# Patient Record
Sex: Female | Born: 1955 | Marital: Single | Smoking: Light tobacco smoker
Health system: Southern US, Community
[De-identification: ages and names within clinical notes are randomized; demographics above are authoritative.]

## PROBLEM LIST (undated history)

## (undated) DIAGNOSIS — E119 Type 2 diabetes mellitus without complications: Secondary | ICD-10-CM

## (undated) DIAGNOSIS — J45909 Unspecified asthma, uncomplicated: Secondary | ICD-10-CM

## (undated) HISTORY — PX: CHOLECYSTECTOMY: SHX55

---

## 2004-04-07 ENCOUNTER — Ambulatory Visit: Payer: Self-pay | Admitting: Family Medicine

## 2006-06-13 ENCOUNTER — Ambulatory Visit: Payer: Self-pay | Admitting: Nurse Practitioner

## 2007-06-22 ENCOUNTER — Ambulatory Visit: Payer: Self-pay | Admitting: *Deleted

## 2007-10-04 ENCOUNTER — Ambulatory Visit: Payer: Self-pay | Admitting: Gastroenterology

## 2007-10-19 ENCOUNTER — Ambulatory Visit: Payer: Self-pay | Admitting: Gastroenterology

## 2007-11-17 ENCOUNTER — Other Ambulatory Visit: Payer: Self-pay

## 2007-11-17 ENCOUNTER — Ambulatory Visit: Payer: Self-pay | Admitting: Surgery

## 2007-11-28 ENCOUNTER — Ambulatory Visit: Payer: Self-pay | Admitting: Surgery

## 2008-01-23 ENCOUNTER — Ambulatory Visit: Payer: Self-pay | Admitting: Family Medicine

## 2012-08-08 ENCOUNTER — Other Ambulatory Visit (HOSPITAL_COMMUNITY)
Admission: RE | Admit: 2012-08-08 | Discharge: 2012-08-08 | Disposition: A | Discharge status: Home / Self Care | Payer: BC Managed Care – PPO | Source: Ambulatory Visit | Attending: Family Medicine | Admitting: Family Medicine

## 2012-08-08 DIAGNOSIS — Z01419 Encounter for gynecological examination (general) (routine) without abnormal findings: Secondary | ICD-10-CM | POA: Insufficient documentation

## 2013-09-25 ENCOUNTER — Ambulatory Visit: Payer: Self-pay | Admitting: Neurosurgery

## 2016-02-20 ENCOUNTER — Other Ambulatory Visit: Payer: Self-pay | Admitting: Family Medicine

## 2016-02-20 ENCOUNTER — Ambulatory Visit
Admission: RE | Admit: 2016-02-20 | Discharge: 2016-02-20 | Disposition: A | Discharge status: Home / Self Care | Payer: BLUE CROSS/BLUE SHIELD | Source: Ambulatory Visit | Attending: Family Medicine | Admitting: Family Medicine

## 2016-02-20 DIAGNOSIS — M79671 Pain in right foot: Secondary | ICD-10-CM

## 2016-04-28 DIAGNOSIS — M2550 Pain in unspecified joint: Secondary | ICD-10-CM | POA: Diagnosis not present

## 2016-04-28 DIAGNOSIS — M62830 Muscle spasm of back: Secondary | ICD-10-CM | POA: Diagnosis not present

## 2016-04-28 DIAGNOSIS — G4700 Insomnia, unspecified: Secondary | ICD-10-CM | POA: Diagnosis not present

## 2016-04-28 DIAGNOSIS — Z23 Encounter for immunization: Secondary | ICD-10-CM | POA: Diagnosis not present

## 2016-04-28 DIAGNOSIS — E1140 Type 2 diabetes mellitus with diabetic neuropathy, unspecified: Secondary | ICD-10-CM | POA: Diagnosis not present

## 2016-06-16 DIAGNOSIS — M47812 Spondylosis without myelopathy or radiculopathy, cervical region: Secondary | ICD-10-CM | POA: Diagnosis not present

## 2016-06-16 DIAGNOSIS — M542 Cervicalgia: Secondary | ICD-10-CM | POA: Diagnosis not present

## 2016-06-16 DIAGNOSIS — Z79891 Long term (current) use of opiate analgesic: Secondary | ICD-10-CM | POA: Diagnosis not present

## 2016-06-16 DIAGNOSIS — M5030 Other cervical disc degeneration, unspecified cervical region: Secondary | ICD-10-CM | POA: Diagnosis not present

## 2016-06-16 DIAGNOSIS — Z79899 Other long term (current) drug therapy: Secondary | ICD-10-CM | POA: Diagnosis not present

## 2016-06-16 DIAGNOSIS — G894 Chronic pain syndrome: Secondary | ICD-10-CM | POA: Diagnosis not present

## 2016-06-21 DIAGNOSIS — M79609 Pain in unspecified limb: Secondary | ICD-10-CM | POA: Diagnosis not present

## 2016-06-21 DIAGNOSIS — M5030 Other cervical disc degeneration, unspecified cervical region: Secondary | ICD-10-CM | POA: Diagnosis not present

## 2016-06-21 DIAGNOSIS — M47812 Spondylosis without myelopathy or radiculopathy, cervical region: Secondary | ICD-10-CM | POA: Diagnosis not present

## 2016-06-21 DIAGNOSIS — M542 Cervicalgia: Secondary | ICD-10-CM | POA: Diagnosis not present

## 2016-07-06 DIAGNOSIS — G5603 Carpal tunnel syndrome, bilateral upper limbs: Secondary | ICD-10-CM | POA: Diagnosis not present

## 2016-07-06 DIAGNOSIS — M5412 Radiculopathy, cervical region: Secondary | ICD-10-CM | POA: Diagnosis not present

## 2016-07-12 DIAGNOSIS — Z79899 Other long term (current) drug therapy: Secondary | ICD-10-CM | POA: Diagnosis not present

## 2016-07-12 DIAGNOSIS — Z79891 Long term (current) use of opiate analgesic: Secondary | ICD-10-CM | POA: Diagnosis not present

## 2016-07-12 DIAGNOSIS — M542 Cervicalgia: Secondary | ICD-10-CM | POA: Diagnosis not present

## 2016-07-12 DIAGNOSIS — G894 Chronic pain syndrome: Secondary | ICD-10-CM | POA: Diagnosis not present

## 2016-08-02 DIAGNOSIS — M47812 Spondylosis without myelopathy or radiculopathy, cervical region: Secondary | ICD-10-CM | POA: Diagnosis not present

## 2016-08-02 DIAGNOSIS — M5030 Other cervical disc degeneration, unspecified cervical region: Secondary | ICD-10-CM | POA: Diagnosis not present

## 2016-08-02 DIAGNOSIS — M542 Cervicalgia: Secondary | ICD-10-CM | POA: Diagnosis not present

## 2016-08-02 DIAGNOSIS — G894 Chronic pain syndrome: Secondary | ICD-10-CM | POA: Diagnosis not present

## 2016-09-24 ENCOUNTER — Emergency Department
Admission: EM | Admit: 2016-09-24 | Discharge: 2016-09-24 | Disposition: A | Discharge status: Home / Self Care | Payer: Worker's Compensation | Attending: Emergency Medicine | Admitting: Emergency Medicine

## 2016-09-24 ENCOUNTER — Encounter: Payer: Self-pay | Admitting: Emergency Medicine

## 2016-09-24 ENCOUNTER — Emergency Department: Payer: Worker's Compensation

## 2016-09-24 DIAGNOSIS — Z23 Encounter for immunization: Secondary | ICD-10-CM | POA: Insufficient documentation

## 2016-09-24 DIAGNOSIS — Y999 Unspecified external cause status: Secondary | ICD-10-CM | POA: Insufficient documentation

## 2016-09-24 DIAGNOSIS — M25579 Pain in unspecified ankle and joints of unspecified foot: Secondary | ICD-10-CM | POA: Diagnosis not present

## 2016-09-24 DIAGNOSIS — T148XXA Other injury of unspecified body region, initial encounter: Secondary | ICD-10-CM | POA: Diagnosis not present

## 2016-09-24 DIAGNOSIS — J45909 Unspecified asthma, uncomplicated: Secondary | ICD-10-CM | POA: Insufficient documentation

## 2016-09-24 DIAGNOSIS — S93402A Sprain of unspecified ligament of left ankle, initial encounter: Secondary | ICD-10-CM | POA: Insufficient documentation

## 2016-09-24 DIAGNOSIS — S80211A Abrasion, right knee, initial encounter: Secondary | ICD-10-CM | POA: Diagnosis not present

## 2016-09-24 DIAGNOSIS — Y939 Activity, unspecified: Secondary | ICD-10-CM | POA: Insufficient documentation

## 2016-09-24 DIAGNOSIS — E119 Type 2 diabetes mellitus without complications: Secondary | ICD-10-CM | POA: Insufficient documentation

## 2016-09-24 DIAGNOSIS — S80212A Abrasion, left knee, initial encounter: Secondary | ICD-10-CM | POA: Insufficient documentation

## 2016-09-24 DIAGNOSIS — F17200 Nicotine dependence, unspecified, uncomplicated: Secondary | ICD-10-CM | POA: Insufficient documentation

## 2016-09-24 DIAGNOSIS — Y929 Unspecified place or not applicable: Secondary | ICD-10-CM | POA: Insufficient documentation

## 2016-09-24 DIAGNOSIS — T07XXXA Unspecified multiple injuries, initial encounter: Secondary | ICD-10-CM

## 2016-09-24 DIAGNOSIS — S99912A Unspecified injury of left ankle, initial encounter: Secondary | ICD-10-CM | POA: Diagnosis not present

## 2016-09-24 DIAGNOSIS — M25572 Pain in left ankle and joints of left foot: Secondary | ICD-10-CM | POA: Diagnosis not present

## 2016-09-24 DIAGNOSIS — W010XXA Fall on same level from slipping, tripping and stumbling without subsequent striking against object, initial encounter: Secondary | ICD-10-CM | POA: Insufficient documentation

## 2016-09-24 HISTORY — DX: Type 2 diabetes mellitus without complications: E11.9

## 2016-09-24 HISTORY — DX: Unspecified asthma, uncomplicated: J45.909

## 2016-09-24 MED ORDER — IBUPROFEN 600 MG PO TABS
600.0000 mg | ORAL_TABLET | Freq: Once | ORAL | Status: AC
  Administered 2016-09-24: 600 mg via ORAL
  Filled 2016-09-24: qty 1

## 2016-09-24 MED ORDER — TETANUS-DIPHTH-ACELL PERTUSSIS 5-2.5-18.5 LF-MCG/0.5 IM SUSP
0.5000 mL | Freq: Once | INTRAMUSCULAR | Status: AC
  Administered 2016-09-24: 0.5 mL via INTRAMUSCULAR
  Filled 2016-09-24: qty 0.5

## 2016-09-24 MED ORDER — MORPHINE SULFATE (PF) 4 MG/ML IV SOLN
4.0000 mg | Freq: Once | INTRAVENOUS | Status: AC
Start: 2016-09-24 — End: 2016-09-24
  Administered 2016-09-24: 4 mg via INTRAVENOUS
  Filled 2016-09-24: qty 1

## 2016-09-24 MED ORDER — ONDANSETRON HCL 4 MG/2ML IJ SOLN
4.0000 mg | Freq: Once | INTRAMUSCULAR | Status: AC
  Administered 2016-09-24: 4 mg via INTRAVENOUS
  Filled 2016-09-24: qty 2

## 2016-09-24 NOTE — ED Triage Notes (Signed)
Pt arrived to ED by EMS from work after falling injuring her left ankle and left knee. EMS gave 15 mcg of Fentanyl in route.

## 2016-09-24 NOTE — ED Notes (Signed)
Report from kim, rn.  

## 2016-09-24 NOTE — ED Notes (Signed)
E-signature pad not working in room at this time. Pt verbally consented to understanding of DC instructions.

## 2016-09-24 NOTE — ED Provider Notes (Signed)
[facility] Emergency Department Provider Note ____________________________________________   I have reviewed the triage vital signs and the triage nursing note.  HISTORY  Chief Complaint Fall   Historian Patient  HPI Colleen Andrade is a 61 y.o. female presenting for left ankle pain and swelling after tripping and falling. She landed onto her knees and has an abrasion to her left knee, and mild discomfort to her hands, but severe pain and swelling to her left ankle.  No head injury or neck pain, chest or abdominal pain. No hip pain.    Past Medical History:  Diagnosis Date  . Asthma   . Diabetes mellitus without complication (HCC)     There are no active problems to display for this patient.   Past Surgical History:  Procedure Laterality Date  . CHOLECYSTECTOMY      Prior to Admission medications   Not on File    No Known Allergies  History reviewed. No pertinent family history.  Social History Social History  Substance Use Topics  . Smoking status: Light Tobacco Smoker  . Smokeless tobacco: Never Used  . Alcohol use No    Review of Systems  Constitutional: Negative for fever. Eyes: Negative for visual changes. ENT: Negative for sore throat. Cardiovascular: Negative for chest pain. Respiratory: Negative for shortness of breath. Gastrointestinal: Negative for abdominal pain, vomiting and diarrhea. Genitourinary: Negative for dysuria. Musculoskeletal: Negative for back pain.  The ankle pain as per history of present illness. Skin: Negative for rash. Neurological: Negative for headache. 10 point Review of Systems otherwise negative ____________________________________________   PHYSICAL EXAM:  VITAL SIGNS: ED Triage Vitals  Enc Vitals Group     BP 09/24/16 1723 (!) 153/78     Pulse Rate 09/24/16 1721 69     Resp 09/24/16 1721 18     Temp 09/24/16 1721 97.9 F (36.6 C)     Temp Source 09/24/16 1721 Oral     SpO2  09/24/16 1718 94 %     Weight 09/24/16 1722 240 lb (108.9 kg)     Height 09/24/16 1722 5\' 6"  (1.676 m)     Head Circumference --      Peak Flow --      Pain Score 09/24/16 1722 7     Pain Loc --      Pain Edu? --      Excl. in GC? --      Constitutional: Alert and oriented. Well appearing and in no distress. HEENT   Head: Normocephalic and atraumatic.      Eyes: Conjunctivae are normal. PERRL. Normal extraocular movements.      Ears:         Nose: No congestion/rhinnorhea.   Mouth/Throat: Mucous membranes are moist.   Neck: No stridor. Cardiovascular/Chest: Normal rate, regular rhythm.  No murmurs, rubs, or gallops. Respiratory: Normal respiratory effort without tachypnea nor retractions. Breath sounds are clear and equal bilaterally. No wheezes/rales/rhonchi. Gastrointestinal: Soft. No distention, no guarding, no rebound. Nontender.    Genitourinary/rectal:Deferred Musculoskeletal: Pelvis stable. Left ankle moderately swollen especially on the lateral aspect. Neurovascularly intact left lower extremity. Other four extremities without traumatic findings other than mild abrasion to right and left knees. Neurologic:  Normal speech and language. No gross or focal neurologic deficits are appreciated. Skin:  Skin is warm, dry and intact. No rash noted. Psychiatric: Mood and affect are normal. Speech and behavior are normal. Patient exhibits appropriate insight and judgment.   ____________________________________________  LABS (pertinent positives/negatives)  Labs Reviewed -  No data to display  ____________________________________________    EKG I, Governor Rooks, MD, the attending physician have personally viewed and interpreted all ECGs.  None ____________________________________________  RADIOLOGY All Xrays were viewed by me. Imaging interpreted by Radiologist.  Left ankle x-ray completed:  FINDINGS: No fracture or malalignment. Minimal spurring medially.  Small plantar calcaneal spur  IMPRESSION: 1. No acute osseous abnormality 2. Small plantar calcaneal spur __________________________________________  PROCEDURES  Procedure(s) performed: Ace wrap placed by tech or nurse  Critical Care performed: None  ____________________________________________   ED COURSE / ASSESSMENT AND PLAN  Pertinent labs & imaging results that were available during my care of the patient were reviewed by me and considered in my medical decision making (see chart for details).   Her main complaint is left ankle pain whether swelling. X-ray shows no fracture. She'll be treated clinically as sprain. She was given an Ace wrap here. We discussed anti-inflammatory ibuprofen as well as ice and elevation. I recommended she follow up with her primary care doctor.  She has a couple of abrasions to her knees. Last tetanus shot about 7 years ago, given tdap here.   CONSULTATIONS:   None   Patient / Family / Caregiver informed of clinical course, medical decision-making process, and agree with plan.   I discussed return precautions, follow-up instructions, and discharge instructions with patient and/or family.   ___________________________________________   FINAL CLINICAL IMPRESSION(S) / ED DIAGNOSES   Final diagnoses:  Sprain of left ankle, unspecified ligament, initial encounter  Abrasion, multiple sites              Note: This dictation was prepared with Dragon dictation. Any transcriptional errors that result from this process are unintentional    Governor Rooks, MD 09/24/16 [phone]

## 2016-09-24 NOTE — Discharge Instructions (Signed)
You were evaluated after ankle injury, and no fracture was found.  You were placed in an ace wrap due to suspected sprain.   You may ice pack the area for 15 minutes avery few hours for the next 24 hours.  You may take over the counter ibuprofen 600mg  every 8 hours as needed for 4-5 days.  Return to the emergency department immediately for any new or worsening pain, numbness, tingling, or any new concerns such as neck pain, chest pain, trouble breathing.  Follow-up with your primary care doctor for reevaluation in about one week. You may bear weight as tolerated.

## 2016-10-08 ENCOUNTER — Ambulatory Visit (INDEPENDENT_AMBULATORY_CARE_PROVIDER_SITE_OTHER): Payer: Worker's Compensation

## 2016-10-08 ENCOUNTER — Ambulatory Visit (INDEPENDENT_AMBULATORY_CARE_PROVIDER_SITE_OTHER): Payer: Worker's Compensation | Admitting: Family Medicine

## 2016-10-08 VITALS — BP 138/82 | HR 64 | Temp 98.2°F | Resp 18 | Ht 66.0 in | Wt 238.2 lb

## 2016-10-08 DIAGNOSIS — S93492A Sprain of other ligament of left ankle, initial encounter: Secondary | ICD-10-CM

## 2016-10-08 DIAGNOSIS — M79672 Pain in left foot: Secondary | ICD-10-CM

## 2016-10-08 DIAGNOSIS — S93602A Unspecified sprain of left foot, initial encounter: Secondary | ICD-10-CM | POA: Diagnosis not present

## 2016-10-08 MED ORDER — MELOXICAM 15 MG PO TABS: 15.0000 mg | ORAL_TABLET | Freq: Every day | ORAL | 0 refills | Status: DC

## 2016-10-08 NOTE — Patient Instructions (Addendum)
Do not use any other otc pain medication other than tylenol/acetaminophen - so no aleve, ibuprofen, motrin, advil, etc.   How to Use a Stirrup Ankle Brace A stirrup ankle brace is a device that is used to provide stability to an injured ankle so it can heal. It may be put on after an ankle injury, such as an ankle strain or sprain. It may also be put on after a cast around the ankle has been removed. A stirrup ankle brace can be worn comfortably inside most athletic shoes. An ankle brace is made of stiff material that fits under the arch of the foot and goes up the sides of the leg. The liner of the brace may be made of a soft gel-like material, or it may be inflatable. The liner lets the brace fit the ankle well without causing pressure on the ankle bones. How do I put on a stirrup ankle brace?  Place the bottom of your foot on the bottom of the brace (stirrup).  Pull the brace up against the sides of your ankle.  To keep the brace in place, fasten the straps on the brace or use an elastic wrap.  Adjust the straps or elastic wrap so that your ankle is snugly supported. The brace should not be loose or too tight. What if I feel pain or discomfort? If the brace feels too tight, you should be able to easily adjust it to make it more comfortable. Let your health care provider know if you cannot adjust your brace in a way that is comfortable for you. Do not wear the brace if it causes pain or swelling. Follow these instructions at home:  Wear this brace as told by your health care provider.  Remove the brace when you shower or bathe.  Loosen the brace if your toes tingle, become numb, or turn cold and blue.  Try not to move your ankle too much, but wiggle your toes from time to time. This helps to prevent swelling.  Keep your brace clean and dry. Contact a health care provider if:  You have increased bruising, swelling, or pain.  Your brace causes pain or swelling.  You cannot adjust  your brace to make it comfortable. Get help right away if:  Your toes become blue or cold, and this does not get better when you loosen the brace. This information is not intended to replace advice given to you by your health care provider. Make sure you discuss any questions you have with your health care provider. Document Released: 04/21/2004 Document Revised: 11/27/2015 Document Reviewed: 01/21/2015 Elsevier Interactive Patient Education  2017 [employer] Foot Sprain A foot sprain is an injury to one of the strong bands of tissue (ligaments) that connect and support the many bones in your feet. The ligament can be stretched too much or it can tear. A tear can be either partial or complete. The severity of the sprain depends on how much of the ligament was damaged or torn. What are the causes? A foot sprain is usually caused by suddenly twisting or pivoting your foot. What increases the risk? This injury is more likely to occur in people who:  Play a sport, such as basketball or football.  Exercise or play a sport without warming up.  Start a new workout or sport.  Suddenly increase how long or hard they exercise or play a sport. What are the signs or symptoms? Symptoms of this condition start soon after an injury and include:  Pain, especially in the arch of the foot.  Bruising.  Swelling.  Inability to walk or use the foot to support body weight. How is this diagnosed? This condition is diagnosed with a medical history and physical exam. You may also have imaging tests, such as:  X-rays to make sure there are no broken bones (fractures).  MRI to see if the ligament has torn. How is this treated? Treatment varies depending on the severity of your sprain. Mild sprains can be treated with rest, ice, compression, and elevation (RICE). If your ligament is overstretched or partially torn, treatment usually involves keeping your foot in a fixed position (immobilization) for a  period of time. To help you do this, your health care provider will apply a bandage, splint, or walking boot to keep your foot from moving until it heals. You may also be advised to use crutches or a scooter for a few weeks to avoid bearing weight on your foot while it is healing. If your ligament is fully torn, you may need surgery to reconnect the ligament to the bone. After surgery, a cast or splint will be applied and will need to stay on your foot while it heals. Your health care provider may also suggest exercises or physical therapy to strengthen your foot. Follow these instructions at home: If You Have a Bandage, Splint, or Walking Boot:   Wear it as directed by your health care provider. Remove it only as directed by your health care provider.  Loosen the bandage, splint, or walking boot if your toes become numb and tingle, or if they turn cold and blue. Bathing   If your health care provider approves bathing and showering, cover the bandage or splint with a watertight plastic bag to protect it from water. Do not let the bandage or splint get wet. Managing pain, stiffness, and swelling   If directed, apply ice to the injured area:  Put ice in a plastic bag.  Place a towel between your skin and the bag.  Leave the ice on for 20 minutes, 2-3 times per day.  Move your toes often to avoid stiffness and to lessen swelling.  Raise (elevate) the injured area above the level of your heart while you are sitting or lying down. Driving   Do not drive or operate heavy machinery while taking pain medicine.  Ask your health care provider when it is safe to drive if you have a bandage, splint, or walking boot on your foot. Activity   Rest as directed by your health care provider.  Do not use the injured foot to support your body weight until your health care provider says that you can. Use crutches or other supportive devices as directed by your health care provider.  Ask your health  care provider what activities are safe for you. Gradually increase how much and how far you walk until your health care provider says it is safe to return to full activity.  Do any exercise or physical therapy as directed by your health care provider. General instructions   If a splint was applied, do not put pressure on any part of it until it is fully hardened. This may take several hours.  Take medicines only as directed by your health care provider. These include over-the-counter medicines and prescription medicines.  Keep all follow-up visits as directed by your health care provider. This is important.  When you can walk without pain, wear supportive shoes that have stiff soles. Do not wear  flip-flops, and do not walk barefoot. Contact a health care provider if:  Your pain is not controlled with medicine.  Your bruising or swelling gets worse or does not get better with treatment.  Your splint or walking boot is damaged. Get help right away if:  You develop severe numbness or tingling in your foot.  Your foot turns blue, white, or gray, and it feels cold. This information is not intended to replace advice given to you by your health care provider. Make sure you discuss any questions you have with your health care provider. Document Released: 12/11/2001 Document Revised: 11/27/2015 Document Reviewed: 04/24/2014 Elsevier Interactive Patient Education  2017 [employer]. How to Use a Cast Shoe A cast shoe is a rigid shoe that you wear over a cast. You may have to wear a cast shoe after a foot or leg injury. It helps you to walk and it keeps your cast clean and dry. Your health care provider may give you a cast shoe after you are allowed to use your injured foot or leg to support (bear) your weight. What are the risks? A cast shoe that is not worn properly can lead to cast damage.  Make sure the shoe is positioned and adjusted properly to support the cast.  Bear weight on the cast  shoe only as told by your health care provider. How to use a cast shoe  Wear the cast shoe as told by your health care provider.  Follow the manufacturer's instructions for use.  Make sure the cast shoe is secured tightly and adjusted properly.  Do not wear the cast shoe while you are resting at home. Do not wear it while you are sleeping.  Keep the cast shoe clean and dry.  Do not wear any other kind of footwear until your health care provider says you can. How to care for your cast shoe  Use mild soap and water to clean your cast shoe.  Make sure your cast shoe is completely clean and dry before you put it over your cast. Contact a health care provider if:  Your cast gets wet or damaged.  You have foot pain when you wear the cast shoe.  You have foot pain when you move.  Your foot pain is getting worse or the pain is not getting better over time. This information is not intended to replace advice given to you by your health care provider. Make sure you discuss any questions you have with your health care provider. Document Released: 07/29/2004 Document Revised: 11/27/2015 Document Reviewed: 01/08/2015 Elsevier Interactive Patient Education  2017 [employer]  Ankle Sprain, Phase II Rehab Ask your health care provider which exercises are safe for you. Do exercises exactly as told by your health care provider and adjust them as directed. It is normal to feel mild stretching, pulling, tightness, or discomfort as you do these exercises, but you should stop right away if you feel sudden pain or your pain gets worse.Do not begin these exercises until told by your health care provider. Stretching and range of motion exercises These exercises warm up your muscles and joints and improve the movement and flexibility of your lower leg and ankle. These exercises also help to relieve pain and stiffness. Exercise A: Gastroc stretch, standing   1. Stand with your hands against a  wall. 2. Extend your left / right leg behind you, and bend your front knee slightly. Your heels should be on the floor. 3. Keeping your heels on  the floor and your back knee straight, shift your weight toward the wall. You should feel a gentle stretch in the back of your lower leg (calf). 4. Hold this position for __________ seconds. Repeat __________ times. Complete this exercise __________ times a day. Exercise B: Soleus stretch, standing  1. Stand with your hands against a wall. 2. Extend your left / right leg behind you, and bend your front knee slightly. Both of your heels should be on the floor. 3. Keeping your heels on the floor, bend your back knee and shift your weight slightly over your back leg. You should feel a gentle stretch deep in your calf. 4. Hold this position for __________ seconds. Repeat __________ times. Complete this exercise __________ times a day. Strengthening exercises These exercises build strength and endurance in your lower leg. Endurance is the ability to use your muscles for a long time, even after they get tired. Exercise C: Heel walking (  dorsiflexion) Walk on your heels for __________ seconds or ___________ ft. Keep your toes as high as possible. Repeat __________ times. Complete this exercise __________ times a day. Balance exercises These exercises improve your balance and the reaction and control of your ankle to help improve stability. Exercise D: Multi-angle lunge 1. Stand with your feet together. 2. Take a step forward with your left / right leg, and shift your weight onto that leg. Your back heel will come off the floor, and your back toes will stay in place. 3. Push off your front leg to return your front foot to the starting position next to your other foot. 4. Repeat to the side, to the back, and any other directions as told by your health care provider. Repeat in each direction __________ times. Complete this exercise __________ times a  day. Exercise E: Single leg stand 1. Without shoes, stand near a railing or in a door frame. Hold onto the railing or door frame as needed. 2. Stand on your left / right foot. Keep your big toe down on the floor and try to keep your arch lifted. 3. Hold this position for __________ seconds. Repeat __________ times. Complete this exercise __________ times a day. If this exercise is too easy, you can try it with your eyes closed or while standing on a pillow. Exercise F: Inversion/eversion  You will need a balance board for this exercise. Ask your health care provider where you can get a balance board or how you can make one. 1. Stand on a non-carpeted surface near a countertop or wall. 2. Step onto the balance board so your feet are hip-width apart. 3. Keep your feet in place and keep your upper body and hips steady. Using only your feet and ankles to move the board, do one or both of the following exercises as told by your health care provider:  Tip the board side to side as far as you can, alternating between tipping to the left and tipping to the right. If you can, tip the board so it silently taps the floor. Do not let the board forcefully hit the floor. From time to time, pause to hold a steady position.  Tip the board side to side so the board does not hit the floor at all. From time to time, pause to hold a steady position. Repeat the movement for each exercise __________ times. Complete each exercise __________ times a day. Exercise G: Plantar flexion/dorsiflexion  You will need a balance board for this exercise. Ask your health  care provider where you can get a balance board or how you can make one. 1. Stand on a non-carpeted surface near a countertop or wall. 2. Step onto the balance board so your feet are hip-width apart. 3. Keep your feet in place and keep your upper body and hips steady. Using only your feet and ankles to move the board, do one or both of the following exercises as  told by your health care provider:  Tip the board forward and backward so the board silently taps the floor. Do not let the board forcefully hit the floor. From time to time, pause to hold a steady position.  Tip the board forward and backward so the board does not hit the floor at all. From time to time, pause to hold a steady position. Repeat the movement for each exercise __________ times. Complete each exercise __________ times a day. This information is not intended to replace advice given to you by your health care provider. Make sure you discuss any questions you have with your health care provider. Document Released: 10/11/2005 Document Revised: 02/26/2016 Document Reviewed: 05/05/2015 Elsevier Interactive Patient Education  2017 [employer].

## 2016-10-08 NOTE — Progress Notes (Signed)
Subjective:  By signing my name below, I, Essence Howell, attest that this documentation has been prepared under the direction and in the presence of Norberto Sorenson, MD Electronically Signed: Charline Bills, ED Scribe 10/08/2016 at 4:39 PM.   Patient ID: Colleen Andrade, female    DOB: 11-Sep-1955, 61 y.o.   MRN: [id number]  Chief Complaint  Patient presents with  . Follow-up    W/C    HPI HPI Comments: Colleen Andrade is a 61 y.o. female who presents to the [facility] for a worker's comp follow-up. Pt was seen at [facility] 2 weeks prior for left ankle pain and swelling after tripping and falling. She also had an abrasion to her knee and mild discomfort at her hands. Noted the left lateral ankle was moderately swollen. XR showed no acute abnormality so she was treated for a sprain with RICE and PRN ibuprofen.   Today, pt reports bruising and swelling has improved but she is still experiencing significant pain over her entire left ankle. Pt reports increased ankle pain with ambulating and palpation. She is still also experiencing soreness over the scab on her left knee. Pt states that she has been icing her left ankle nightly and wearing an Ace wrap daily except for today since she was rushing to work. No prior ankle injuries.   Past medical hx, medications and allergies reviewed in detail by myself.   Review of Systems  Musculoskeletal: Positive for arthralgias and joint swelling.  Skin: Positive for color change.      Objective:   Physical Exam  Constitutional: She is oriented to person, place, and time. She appears well-developed and well-nourished. No distress.  HENT:  Head: Normocephalic and atraumatic.  Eyes: Conjunctivae and EOM are normal.  Neck: Neck supple. No tracheal deviation present.  Cardiovascular: Normal rate.   Pulses:      Dorsalis pedis pulses are 2+ on the left side.       Posterior tibial pulses are 2+ on the left side.  Pulmonary/Chest:  Effort normal. No respiratory distress.  Musculoskeletal: Normal range of motion.  Pitting edema over medial and lateral malleolus. No pain with tib/fib squeeze. Positive with metatarsal squeeze. Positive tenderness over AITF. Positive tenderness over proximal 5th metatarsal. Severe point tenderness over all metatarsals. Plantar and dorsiflexion 5/5 strength.  Neurological: She is alert and oriented to person, place, and time.  Still slight antalgic gait.   Skin: Skin is warm and dry.  Hyperpigmentation over the mid foot.  Psychiatric: She has a normal mood and affect. Her behavior is normal.  Nursing note and vitals reviewed.  BP 138/82   Pulse 64   Temp 98.2 F (36.8 C) (Oral)   Resp 18   Ht  (1.676 m)   Wt 238 lb 3.2 oz (108 kg)   SpO2 99%   BMI 38.45 kg/m    Dg Ankle Complete Left  Result Date: 09/24/2016 CLINICAL DATA:  Left ankle pain after a fall EXAM: LEFT ANKLE COMPLETE - 3+ VIEW COMPARISON:  None. FINDINGS: No fracture or malalignment. Minimal spurring medially. Small plantar calcaneal spur IMPRESSION: 1. No acute osseous abnormality 2. Small plantar calcaneal spur Electronically Signed   By: Jasmine Pang M.D.   On: 09/24/2016 19:30   Dg Foot Complete Left  Result Date: 10/08/2016 CLINICAL DATA:  Severe left foot pain in the region of the metatarsals, greater laterally, since a fall 2 weeks ago. EXAM: LEFT FOOT - COMPLETE 3+ VIEW COMPARISON:  Left ankle radiographs dated 09/24/2016. FINDINGS: Flattening of the normal plantar arch. No fracture or dislocation. Mid mild inferior and posterior calcaneal spur formation. Mild posterior talotibial spur formation. IMPRESSION: 1. No fracture. 2. Pes planus. 3. Mild degenerative changes. Electronically Signed   By: Beckie Salts M.D.   On: 10/08/2016 16:56      Assessment & Plan:   1. Sprain of anterior talofibular ligament of left ankle, initial encounter   2. Acute foot pain, left   3. Foot sprain, left, initial encounter     WC injury - Sprain suffered 2 wks ago 09/24/16 and seen at [facility] ED same day. pt has had minimal improvement since that time, still with sig pain. Has not been on any restrictions at work, wearing normal shoes which are glorified slippers (thin hyperflexible rubber base), only able to ice at night due to schedule. She clearly needs more stability for her foot/ankle while her sprains heal - placed in stirrup gel splint for ankle sprain and post-op shoe for foot sprain.  Light duty at work with restrictions on standing, walking, etc - see letter. Start meloxicam qam. Increase elevation and ice. Recheck in 2 wks, sooner if worse.  Orders Placed This Encounter  Procedures  . DG Foot Complete Left    Standing Status:   Future    Number of Occurrences:   1    Standing Expiration Date:   10/08/2017    Order Specific Question:   Reason for Exam (SYMPTOM  OR DIAGNOSIS REQUIRED)    Answer:   severe pain over lateral > medial metatarsals 2 wks after fall    Order Specific Question:   Is the patient pregnant?    Answer:   No    Order Specific Question:   Preferred imaging location?    Answer:   External    Meds ordered this encounter  Medications  . meloxicam (MOBIC) 15 MG tablet    Sig: Take 1 tablet (15 mg total) by mouth daily.    Dispense:  15 tablet    Refill:  0    I personally performed the services described in this documentation, which was scribed in my presence. The recorded information has been reviewed and considered, and addended by me as needed.   Norberto Sorenson, M.D.  Primary Care at [facility] [address] [place], [place] [address] [phone] phone [phone] fax  10/10/16 2:48 PM

## 2016-10-27 ENCOUNTER — Ambulatory Visit (INDEPENDENT_AMBULATORY_CARE_PROVIDER_SITE_OTHER): Payer: Worker's Compensation | Admitting: Urgent Care

## 2016-10-27 VITALS — BP 117/72 | HR 71 | Temp 98.5°F | Resp 17 | Ht 66.0 in | Wt 240.0 lb

## 2016-10-27 DIAGNOSIS — M25572 Pain in left ankle and joints of left foot: Secondary | ICD-10-CM

## 2016-10-27 DIAGNOSIS — M542 Cervicalgia: Secondary | ICD-10-CM | POA: Diagnosis not present

## 2016-10-27 DIAGNOSIS — M47812 Spondylosis without myelopathy or radiculopathy, cervical region: Secondary | ICD-10-CM | POA: Diagnosis not present

## 2016-10-27 DIAGNOSIS — Z79891 Long term (current) use of opiate analgesic: Secondary | ICD-10-CM | POA: Diagnosis not present

## 2016-10-27 DIAGNOSIS — Z79899 Other long term (current) drug therapy: Secondary | ICD-10-CM | POA: Diagnosis not present

## 2016-10-27 DIAGNOSIS — M79672 Pain in left foot: Secondary | ICD-10-CM

## 2016-10-27 DIAGNOSIS — S93602D Unspecified sprain of left foot, subsequent encounter: Secondary | ICD-10-CM

## 2016-10-27 DIAGNOSIS — S93492D Sprain of other ligament of left ankle, subsequent encounter: Secondary | ICD-10-CM

## 2016-10-27 DIAGNOSIS — Z026 Encounter for examination for insurance purposes: Secondary | ICD-10-CM

## 2016-10-27 DIAGNOSIS — G894 Chronic pain syndrome: Secondary | ICD-10-CM | POA: Diagnosis not present

## 2016-10-27 NOTE — Patient Instructions (Addendum)
Ankle Sprain An ankle sprain is a stretch or tear in one of the tough, fiber-like tissues (ligaments) in the ankle. The ligaments in your ankle help to hold the bones of the ankle together. What are the causes? This condition is often caused by stepping on or falling on the outer edge of the foot. What increases the risk? This condition is more likely to develop in people who play sports. What are the signs or symptoms? Symptoms of this condition include:  Pain in your ankle.  Swelling.  Bruising. Bruising may develop right after you sprain your ankle or 1-2 days later.  Trouble standing or walking, especially when you turn or change directions.  How is this diagnosed? This condition is diagnosed with a physical exam. During the exam, your health care provider will press on certain parts of your foot and ankle and try to move them in certain ways. X-rays may be taken to see how severe the sprain is and to check for broken bones. How is this treated? This condition may be treated with:  A brace. This is used to keep the ankle from moving until it heals.  An elastic bandage. This is used to support the ankle.  Crutches.  Pain medicine.  Surgery. This may be needed if the sprain is severe.  Physical therapy. This may help to improve the range of motion in the ankle.  Follow these instructions at home:  Rest your ankle.  Take over-the-counter and prescription medicines only as told by your health care provider.  For 2-3 days, keep your ankle raised (elevated) above the level of your heart as much as possible.  If directed, apply ice to the area: ? Put ice in a plastic bag. ? Place a towel between your skin and the bag. ? Leave the ice on for 20 minutes, 2-3 times a day.  If you were given a brace: ? Wear it as directed. ? Remove it to shower or bathe. ? Try not to move your ankle much, but wiggle your toes from time to time. This helps to prevent swelling.  If you were  given an elastic bandage (dressing): ? Remove it to shower or bathe. ? Try not to move your ankle much, but wiggle your toes from time to time. This helps to prevent swelling. ? Adjust the dressing to make it more comfortable if it feels too tight. ? Loosen the dressing if you have numbness or tingling in your foot, or if your foot becomes cold and blue.  If you have crutches, use them as told by your health care provider. Continue to use them until you can walk without feeling pain in your ankle. Contact a health care provider if:  You have rapidly increasing bruising or swelling.  Your pain is not relieved with medicine. Get help right away if:  Your toes or foot becomes numb or blue.  You have severe pain that gets worse. This information is not intended to replace advice given to you by your health care provider. Make sure you discuss any questions you have with your health care provider. Document Released: 06/21/2005 Document Revised: 10/29/2015 Document Reviewed: 01/21/2015 Elsevier Interactive Patient Education  2017 Elsevier Inc.     IF you received an x-ray today, you will receive an invoice from [facility]. Please contact [facility] at [phone] with questions or concerns regarding your invoice.   IF you received labwork today, you will receive an invoice from [facility]. Please contact [facility] at [phone]   or concerns regarding your invoice.   Our billing staff will not be able to assist you with questions regarding bills from these companies.  You will be contacted with the lab results as soon as they are available. The fastest way to get your results is to activate your My Chart account. Instructions are located on the last page of this paperwork. If you have not heard from [place] regarding the results in 2 weeks, please contact this office.

## 2016-10-27 NOTE — Progress Notes (Signed)
   MRN: [id number] DOB: 10/27/1955  Subjective:   Colleen Andrade is a 60 y.o. female presenting for worker's comp visit. Patient suffered an ankle and foot sprain while at work on 09/24/2016. Reports improvement in her foot and ankle swelling, pain. She is using ankle brace, hard soled post-op shoe. Meloxicam has also helped pain and inflammation.   Colleen Andrade's medications list, allergies, past medical history and past surgical history were reviewed and excluded from this note due to being a worker's comp case.   Objective:   Vitals: BP 117/72   Pulse 71   Temp 98.5 F (36.9 C) (Oral)   Resp 17   Ht  (1.676 m)   Wt 240 lb (108.9 kg)   SpO2 97%   BMI 38.74 kg/m   Physical Exam  Constitutional: She is oriented to person, place, and time. She appears well-developed and well-nourished.  Cardiovascular: Normal rate.   Pulmonary/Chest: Effort normal.  Musculoskeletal:       Left ankle: She exhibits swelling (trace). She exhibits normal range of motion, no ecchymosis, no deformity and no laceration. Tenderness. Medial malleolus and AITFL tenderness found. No lateral malleolus, no CF ligament, no posterior TFL, no head of 5th metatarsal and no proximal fibula tenderness found. Achilles tendon exhibits no pain and no defect.       Left foot: There is tenderness (over area depicted). There is normal range of motion, no bony tenderness, no swelling, normal capillary refill, no crepitus, no deformity and no laceration.       Feet:  Neurological: She is alert and oriented to person, place, and time.   Assessment and Plan :   1. Sprain of anterior talofibular ligament of left ankle, subsequent encounter 2. Acute left ankle pain 3. Foot sprain, left, subsequent encounter 4. Left foot pain 5. Encounter related to worker's compensation claim - Improved, will decrease work restrictions. Discussed ankle rehab. Continue conservative management. RTC in 2 weeks for f/u. I expect f/u may no longer  be needed at that point.  Wallis Bamberg, PA-C Primary Care at [facility] [phone] 10/27/2016 3:16 PM

## 2016-11-15 DIAGNOSIS — M50322 Other cervical disc degeneration at C5-C6 level: Secondary | ICD-10-CM | POA: Diagnosis not present

## 2016-11-15 DIAGNOSIS — M9902 Segmental and somatic dysfunction of thoracic region: Secondary | ICD-10-CM | POA: Diagnosis not present

## 2016-11-15 DIAGNOSIS — M5114 Intervertebral disc disorders with radiculopathy, thoracic region: Secondary | ICD-10-CM | POA: Diagnosis not present

## 2016-11-15 DIAGNOSIS — M9901 Segmental and somatic dysfunction of cervical region: Secondary | ICD-10-CM | POA: Diagnosis not present

## 2016-11-16 DIAGNOSIS — M9901 Segmental and somatic dysfunction of cervical region: Secondary | ICD-10-CM | POA: Diagnosis not present

## 2016-11-16 DIAGNOSIS — M9903 Segmental and somatic dysfunction of lumbar region: Secondary | ICD-10-CM | POA: Diagnosis not present

## 2016-11-16 DIAGNOSIS — M50322 Other cervical disc degeneration at C5-C6 level: Secondary | ICD-10-CM | POA: Diagnosis not present

## 2016-11-16 DIAGNOSIS — M545 Low back pain: Secondary | ICD-10-CM | POA: Diagnosis not present

## 2016-11-17 DIAGNOSIS — M9901 Segmental and somatic dysfunction of cervical region: Secondary | ICD-10-CM | POA: Diagnosis not present

## 2016-11-17 DIAGNOSIS — M50322 Other cervical disc degeneration at C5-C6 level: Secondary | ICD-10-CM | POA: Diagnosis not present

## 2016-11-17 DIAGNOSIS — M545 Low back pain: Secondary | ICD-10-CM | POA: Diagnosis not present

## 2016-11-17 DIAGNOSIS — M9903 Segmental and somatic dysfunction of lumbar region: Secondary | ICD-10-CM | POA: Diagnosis not present

## 2016-11-18 DIAGNOSIS — B029 Zoster without complications: Secondary | ICD-10-CM | POA: Diagnosis not present

## 2016-12-13 DIAGNOSIS — I10 Essential (primary) hypertension: Secondary | ICD-10-CM | POA: Diagnosis not present

## 2016-12-13 DIAGNOSIS — F418 Other specified anxiety disorders: Secondary | ICD-10-CM | POA: Diagnosis not present

## 2016-12-13 DIAGNOSIS — E1165 Type 2 diabetes mellitus with hyperglycemia: Secondary | ICD-10-CM | POA: Diagnosis not present

## 2016-12-13 DIAGNOSIS — E1140 Type 2 diabetes mellitus with diabetic neuropathy, unspecified: Secondary | ICD-10-CM | POA: Diagnosis not present

## 2016-12-27 DIAGNOSIS — M47812 Spondylosis without myelopathy or radiculopathy, cervical region: Secondary | ICD-10-CM | POA: Diagnosis not present

## 2017-01-12 DIAGNOSIS — E1165 Type 2 diabetes mellitus with hyperglycemia: Secondary | ICD-10-CM | POA: Diagnosis not present

## 2017-03-15 DIAGNOSIS — Z113 Encounter for screening for infections with a predominantly sexual mode of transmission: Secondary | ICD-10-CM | POA: Diagnosis not present

## 2017-03-15 DIAGNOSIS — N898 Other specified noninflammatory disorders of vagina: Secondary | ICD-10-CM | POA: Diagnosis not present

## 2017-03-15 DIAGNOSIS — E1165 Type 2 diabetes mellitus with hyperglycemia: Secondary | ICD-10-CM | POA: Diagnosis not present

## 2017-04-29 DIAGNOSIS — Z23 Encounter for immunization: Secondary | ICD-10-CM | POA: Diagnosis not present

## 2017-04-29 DIAGNOSIS — R300 Dysuria: Secondary | ICD-10-CM | POA: Diagnosis not present

## 2017-06-08 DIAGNOSIS — M5030 Other cervical disc degeneration, unspecified cervical region: Secondary | ICD-10-CM | POA: Diagnosis not present

## 2017-06-08 DIAGNOSIS — M542 Cervicalgia: Secondary | ICD-10-CM | POA: Diagnosis not present

## 2017-06-08 DIAGNOSIS — M47812 Spondylosis without myelopathy or radiculopathy, cervical region: Secondary | ICD-10-CM | POA: Diagnosis not present

## 2017-06-08 DIAGNOSIS — M5412 Radiculopathy, cervical region: Secondary | ICD-10-CM | POA: Diagnosis not present

## 2018-01-26 ENCOUNTER — Emergency Department: Payer: Self-pay

## 2018-01-26 ENCOUNTER — Emergency Department
Admission: EM | Admit: 2018-01-26 | Discharge: 2018-01-26 | Disposition: A | Discharge status: Home / Self Care | Payer: Self-pay | Attending: Emergency Medicine | Admitting: Emergency Medicine

## 2018-01-26 ENCOUNTER — Encounter: Payer: Self-pay | Admitting: Emergency Medicine

## 2018-01-26 ENCOUNTER — Other Ambulatory Visit: Payer: Self-pay

## 2018-01-26 DIAGNOSIS — Z823 Family history of stroke: Secondary | ICD-10-CM | POA: Insufficient documentation

## 2018-01-26 DIAGNOSIS — G510 Bell's palsy: Secondary | ICD-10-CM | POA: Insufficient documentation

## 2018-01-26 DIAGNOSIS — E119 Type 2 diabetes mellitus without complications: Secondary | ICD-10-CM | POA: Insufficient documentation

## 2018-01-26 DIAGNOSIS — Z720 Tobacco use: Secondary | ICD-10-CM | POA: Insufficient documentation

## 2018-01-26 DIAGNOSIS — R51 Headache: Secondary | ICD-10-CM | POA: Insufficient documentation

## 2018-01-26 DIAGNOSIS — J45909 Unspecified asthma, uncomplicated: Secondary | ICD-10-CM | POA: Insufficient documentation

## 2018-01-26 LAB — DIFFERENTIAL
Basophils Absolute: 0 10*3/uL (ref 0–0.1)
Basophils Relative: 1 %
Eosinophils Absolute: 0.3 10*3/uL (ref 0–0.7)
Eosinophils Relative: 5 %
Lymphocytes Relative: 30 %
Lymphs Abs: 1.6 10*3/uL (ref 1.0–3.6)
Monocytes Absolute: 0.4 10*3/uL (ref 0.2–0.9)
Monocytes Relative: 7 %
Neutro Abs: 3.2 10*3/uL (ref 1.4–6.5)
Neutrophils Relative %: 57 %

## 2018-01-26 LAB — COMPREHENSIVE METABOLIC PANEL
ALT: 53 U/L — ABNORMAL HIGH (ref 0–44)
AST: 45 U/L — ABNORMAL HIGH (ref 15–41)
Albumin: 4 g/dL (ref 3.5–5.0)
Alkaline Phosphatase: 67 U/L (ref 38–126)
Anion gap: 7 (ref 5–15)
BUN: 20 mg/dL (ref 6–20)
CO2: 24 mmol/L (ref 22–32)
Calcium: 9 mg/dL (ref 8.9–10.3)
Chloride: 107 mmol/L (ref 98–111)
Creatinine, Ser: 0.75 mg/dL (ref 0.44–1.00)
GFR calc Af Amer: >60 mL/min (ref >60)
GFR calc non Af Amer: >60 mL/min (ref >60)
Glucose, Bld: 166 mg/dL — ABNORMAL HIGH (ref 70–99)
Potassium: 3.8 mmol/L (ref 3.5–5.1)
Sodium: 138 mmol/L (ref 135–145)
Total Bilirubin: 0.6 mg/dL (ref 0.3–1.2)
Total Protein: 7.6 g/dL (ref 6.5–8.1)

## 2018-01-26 LAB — CBC
HCT: 40.2 % (ref 35.0–47.0)
Hemoglobin: 13.5 g/dL (ref 12.0–16.0)
MCH: 30.9 pg (ref 26.0–34.0)
MCHC: 33.7 g/dL (ref 32.0–36.0)
MCV: 91.9 fL (ref 80.0–100.0)
Platelets: 263 10*3/uL (ref 150–440)
RBC: 4.38 MIL/uL (ref 3.80–5.20)
RDW: 13.8 % (ref 11.5–14.5)
WBC: 5.6 10*3/uL (ref 3.6–11.0)

## 2018-01-26 LAB — GLUCOSE, CAPILLARY: Glucose-Capillary: 170 mg/dL — ABNORMAL HIGH (ref 70–99)

## 2018-01-26 LAB — PROTIME-INR
INR: 1.04
Prothrombin Time: 13.5 seconds (ref 11.4–15.2)

## 2018-01-26 LAB — TROPONIN I: Troponin I: <0.03 ng/mL (ref <0.03)

## 2018-01-26 LAB — APTT: aPTT: 31 seconds (ref 24–36)

## 2018-01-26 MED ORDER — VALACYCLOVIR HCL 1 G PO TABS: 1000.0000 mg | ORAL_TABLET | Freq: Three times a day (TID) | ORAL | 0 refills | Status: AC

## 2018-01-26 MED ORDER — PREDNISONE 20 MG PO TABS
60.0000 mg | ORAL_TABLET | Freq: Once | ORAL | Status: AC
  Administered 2018-01-26: 60 mg via ORAL
  Filled 2018-01-26: qty 3

## 2018-01-26 MED ORDER — VALACYCLOVIR HCL 500 MG PO TABS
1000.0000 mg | ORAL_TABLET | Freq: Once | ORAL | Status: AC
  Administered 2018-01-26: 1000 mg via ORAL
  Filled 2018-01-26: qty 2

## 2018-01-26 MED ORDER — PREDNISONE 20 MG PO TABS: 60.0000 mg | ORAL_TABLET | Freq: Every day | ORAL | 0 refills | Status: DC

## 2018-01-26 NOTE — ED Triage Notes (Addendum)
Pt ambulatory to ED lobby without diff or distress noted; reports sudden onset right sided facial/tongue numbness and right sided HA accomp by dizziness; onset hr PTA; charge nurse notified; pt st family hx CVA; denies hx HA; A&Ox3, MAEW, speech clear, slight rt facial droop; pt st took ASA but unsure of dosing

## 2018-01-26 NOTE — ED Provider Notes (Signed)
[facility] Emergency Department Provider Note ____________________________________________   First MD Initiated Contact with Patient 01/26/18 2051     (approximate)  I have reviewed the triage vital signs and the nursing notes.   HISTORY  Chief Complaint Numbness  HPI Colleen Andrade is a 62 y.o. female with a history of diabetes as well as a family history of CVA who was presented to the emergency department with 1 hour of right-sided facial droop as well as right-sided facial numbness.  Says that she is a strong family history of stroke.  Denies any weakness or numbness in other parts of her body.  Says that she also has an associated headache that is a 3-4 out of 10 on the right side.  However, she says the headache has been persistent over the past 24 hours and has been waxing and waning.  It started as a mild headache and then progressed to an 8 out of 10 but has decreased now to its current state as above.  Patient states that she took an aspirin prior to arrival but does not know the dose.  Past Medical History:  Diagnosis Date  . Asthma   . Diabetes mellitus without complication (HCC)     There are no active problems to display for this patient.   Past Surgical History:  Procedure Laterality Date  . CHOLECYSTECTOMY      Prior to Admission medications   Medication Sig Start Date End Date Taking? Authorizing Provider  meloxicam (MOBIC) 15 MG tablet Take 1 tablet (15 mg total) by mouth daily. 10/08/16   Sherren Mocha, MD    Allergies Patient has no known allergies.  No family history on file.  Social History Social History   Tobacco Use  . Smoking status: Light Tobacco Smoker  . Smokeless tobacco: Never Used  Substance Use Topics  . Alcohol use: No  . Drug use: No    Review of Systems  Constitutional: No fever/chills Eyes: No visual changes. ENT: No sore throat. Cardiovascular: Denies chest pain. Respiratory: Denies shortness of  breath. Gastrointestinal: No abdominal pain.  No nausea, no vomiting.  No diarrhea.  No constipation. Genitourinary: Negative for dysuria. Musculoskeletal: Negative for back pain. Skin: Negative for rash. Neurological: As above   ____________________________________________   PHYSICAL EXAM:  VITAL SIGNS: ED Triage Vitals  Enc Vitals Group     BP 01/26/18 2100 136/77     Pulse Rate 01/26/18 2100 70     Resp 01/26/18 2100 18     Temp 01/26/18 2100 99 F (37.2 C)     Temp Source 01/26/18 2100 Oral     SpO2 01/26/18 2100 97 %     Weight 01/26/18 0045 235 lb (106.6 kg)     Height 01/26/18 0045 5\' 6"  (1.676 m)     Head Circumference --      Peak Flow --      Pain Score 01/26/18 0045 3     Pain Loc --      Pain Edu? --      Excl. in GC? --     Constitutional: Alert and oriented. Well appearing and in no acute distress. Eyes: Conjunctivae are normal.  EOMI.  PERRLA. Head: Atraumatic.  TMs are normal bilaterally. Nose: No congestion/rhinnorhea. Mouth/Throat: Mucous membranes are moist.  Neck: No stridor.   Cardiovascular: Normal rate, regular rhythm. Grossly normal heart sounds.   Respiratory: Normal respiratory effort.  No retractions. Lungs CTAB. Gastrointestinal: Soft and nontender. No distention.  No CVA tenderness. Musculoskeletal: No lower extremity tenderness nor edema.  No joint effusions. Neurologic:  Normal speech and language.  Right-sided facial droop with decreased sensation to light touch on the right. Skin:  Skin is warm, dry and intact. No rash noted. Psychiatric: Mood and affect are normal. Speech and behavior are normal.  NIH Stroke Scale   Time: 9:17 PM Person Administering Scale: Arelia LongestDavid M Schaevitz  Administer stroke scale items in the order listed. Record performance in each category after each subscale exam. Do not go back and change scores. Follow directions provided for each exam technique. Scores should reflect what the patient does, not what the  clinician thinks the patient can do. The clinician should record answers while administering the exam and work quickly. Except where indicated, the patient should not be coached (i.e., repeated requests to patient to make a special effort).   1a  Level of consciousness: 0=alert; keenly responsive  1b. LOC questions:  0=Performs both tasks correctly  1c. LOC commands: 0=Performs both tasks correctly  2.  Best Gaze: 0=normal  3.  Visual: 0=No visual loss  4. Facial Palsy: 2=Partial paralysis (total or near total paralysis of the lower face)  5a.  Motor left arm: 0=No drift, limb holds 90 (or 45) degrees for full 10 seconds  5b.  Motor right arm: 0=No drift, limb holds 90 (or 45) degrees for full 10 seconds  6a. motor left leg: 0=No drift, limb holds 90 (or 45) degrees for full 10 seconds  6b  Motor right leg:  0=No drift, limb holds 90 (or 45) degrees for full 10 seconds  7. Limb Ataxia: 0=Absent  8.  Sensory: 1=Mild to moderate sensory loss; patient feels pinprick is less sharp or is dull on the affected side; there is a loss of superficial pain with pinprick but patient is aware She is being touched  9. Best Language:  0=No aphasia, normal  10. Dysarthria: 0=Normal  11. Extinction and Inattention: 0=No abnormality  12. Distal motor function: 0=Normal   Total:   3   ____________________________________________   LABS (all labs ordered are listed, but only abnormal results are displayed)  Labs Reviewed  GLUCOSE, CAPILLARY - Abnormal; Notable for the following components:      Result Value   Glucose-Capillary 170 (*)    All other components within normal limits  PROTIME-INR  APTT  CBC  DIFFERENTIAL  COMPREHENSIVE METABOLIC PANEL  TROPONIN I  CBG MONITORING, ED   ____________________________________________  EKG  ED ECG REPORT I, Arelia Longestavid M Schaevitz, the attending physician, personally viewed and interpreted this ECG.   Date: 01/26/2018  EKG Time: 2059  Rate: 70  Rhythm:  normal sinus rhythm  Axis: Normal  Intervals:none  ST&T Change: No ST segment elevation or depression.  No abnormal T wave inversion.  ____________________________________________  RADIOLOGY  Noncon head CT without acute finding ____________________________________________   PROCEDURES  Procedure(s) performed:   Procedures  Critical Care performed:   ____________________________________________   INITIAL IMPRESSION / ASSESSMENT AND PLAN / ED COURSE  Pertinent labs & imaging results that were available during my care of the patient were reviewed by me and considered in my medical decision making (see chart for details).  Differential diagnosis includes, but is not limited to, intracranial hemorrhage, meningitis/encephalitis, previous head trauma, cavernous venous thrombosis, tension headache, temporal arteritis, migraine or migraine equivalent, idiopathic intracranial hypertension, and non-specific headache. As part of my medical decision making, I reviewed the following data within the electronic MEDICAL RECORD NUMBER  Notes from prior ED visits  ----------------------------------------- 9:48 PM on 01/26/2018 -----------------------------------------  Discussed the case with the neurology consultant, Dr. Lewis Moccasin, who believes the patient has a Bell's palsy and will benefit from prednisone and artificial tears.  Patient denies having cold sores but with pain.  I will add Valtrex.  Patient understand the diagnosis as well as the treatment plan willing to comply.  Patient with absence of blinking on the right side when compared to the left at this time.  Consistent with Bell's palsy. ____________________________________________   FINAL CLINICAL IMPRESSION(S) / ED DIAGNOSES  Bell's palsy.  NEW MEDICATIONS STARTED DURING THIS VISIT:  New Prescriptions   No medications on file     Note:  This document was prepared using Dragon voice recognition software and may include  unintentional dictation errors.     Myrna Blazer, MD 01/26/18 2149

## 2018-01-26 NOTE — Progress Notes (Signed)
CODE STROKE- PHARMACY COMMUNICATION   Time CODE STROKE called/page received: @ 2113  Time response to CODE STROKE was made (in person or via phone): _0   Time Stroke Kit retrieved from [place] (only if needed):NA  Name of Provider/Nurse contacted: Danise Mina  Past Medical History:  Diagnosis Date  . Asthma   . Diabetes mellitus without complication ([place])    Prior to Admission medications   Medication Sig Start Date End Date Taking? Authorizing Provider  meloxicam (MOBIC) 15 MG tablet Take 1 tablet (15 mg total) by mouth daily. 10/08/16   Shawnee Knapp, MD    Pernell Dupre, PharmD, BCPS Clinical Pharmacist 01/26/2018 9:39 PM

## 2018-01-26 NOTE — ED Notes (Signed)
Pt to CT via w/c

## 2018-01-26 NOTE — Consult Note (Signed)
   [facility] TeleNeurology Consult Services  Impression:  Bells Palsy Not a tpa candidate due WU:[id number]:Less likely to be stroke. Symptoms  not consistent with LVO therefore no NIR  Comments:   Last Known Well: 20:00 [facility] contacted: 21:13 [facility] at bedside: 21:18 NIHSS assessment time: 21:21  Recommendations:  Prednisone 60 mg daily for 1 week. Artificial tears to the right eye. Discussed with ED MD Please call with questions  -----------------------------------------------------------------------------------------  CC: right sided facial numbness  History of Present Illness:  Patient is a 62 year old female presented with right facial numbness that started around 8 PM.  Patient states that she has chronic right arm numbness from neck issue but today she felt the right side of her face felt funny and she had difficulty closing her right eye.  She has a right facial droop.  Denies any other focal weakness speech or language deficits.    Diagnostic: CT Head: No acute Intracranial findings.  Exam: NIH Stroke Scale/Score (NIHSS)   RESULT SUMMARY: 4 points NIH Stroke Scale   INPUTS: 1A: Level of consciousness -> 0 = Alert; keenly responsive 1B: Ask month and age -> 0 = Both questions right 1C: 'Blink eyes' & 'squeeze hands' -> 0 = Performs both tasks 2: Horizontal extraocular movements -> 0 = Normal 3: Visual fields -> 0 = No visual loss 4: Facial palsy -> 3 = Unilateral complete paralysis (upper/lower face) 5A: Left arm motor drift -> 0 = No drift for 10 seconds 5B: Right arm motor drift -> 0 = No drift for 10 seconds 6A: Left leg motor drift -> 0 = No drift for 5 seconds 6B: Right leg motor drift -> 0 = No drift for 5 seconds 7: Limb Ataxia -> 0 = No ataxia 8: Sensation -> 1 = Mild-moderate loss: less sharp/more dull  9: Language/aphasia -> 0 = Normal; no aphasia 10: Dysarthria -> 0 = Normal 11: Extinction/inattention -> 0 = No  abnormality  Medical Decision Making:  - Extensive number of diagnosis or management options are considered above.   - Extensive amount of complex data reviewed.   - High risk of complication and/or morbidity or mortality are associated with differential diagnostic considerations above.  - There may be Uncertain outcome and increased probability of prolonged functional impairment or high probability of severe prolonged functional impairment associated with some of these differential diagnosis.  Medical Data Reviewed:  1.Data reviewed include clinical labs, radiology,  Medical Tests;   2.Tests results discussed w/performing or interpreting physician;   3.Obtaining/reviewing old medical records;  4.Obtaining case history from another source;  5.Independent review of image, tracing or specimen.    Patient was informed the neurology consult would happen via telehealth consult by way of interactive audio and video telecommunications and consented to receiving care in this manner.

## 2018-01-26 NOTE — ED Notes (Addendum)
Pt to room 6; placed in hosp gown & on card monitor; teleneuro monitor at beside

## 2018-03-20 ENCOUNTER — Encounter: Payer: Self-pay | Admitting: Student in an Organized Health Care Education/Training Program

## 2018-03-20 ENCOUNTER — Other Ambulatory Visit: Payer: Self-pay

## 2018-03-20 ENCOUNTER — Ambulatory Visit
Admission: RE | Admit: 2018-03-20 | Discharge: 2018-03-20 | Disposition: A | Discharge status: Home / Self Care | Payer: BLUE CROSS/BLUE SHIELD | Source: Ambulatory Visit | Attending: Student in an Organized Health Care Education/Training Program | Admitting: Student in an Organized Health Care Education/Training Program

## 2018-03-20 ENCOUNTER — Encounter (INDEPENDENT_AMBULATORY_CARE_PROVIDER_SITE_OTHER): Payer: Self-pay

## 2018-03-20 ENCOUNTER — Ambulatory Visit: Payer: BLUE CROSS/BLUE SHIELD | Admitting: Student in an Organized Health Care Education/Training Program

## 2018-03-20 VITALS — BP 127/73 | HR 66 | Temp 98.2°F | Resp 16 | Ht 66.0 in | Wt 239.0 lb

## 2018-03-20 DIAGNOSIS — G8929 Other chronic pain: Secondary | ICD-10-CM | POA: Diagnosis not present

## 2018-03-20 DIAGNOSIS — M47816 Spondylosis without myelopathy or radiculopathy, lumbar region: Secondary | ICD-10-CM | POA: Diagnosis not present

## 2018-03-20 DIAGNOSIS — M533 Sacrococcygeal disorders, not elsewhere classified: Secondary | ICD-10-CM

## 2018-03-20 DIAGNOSIS — M5412 Radiculopathy, cervical region: Secondary | ICD-10-CM | POA: Insufficient documentation

## 2018-03-20 DIAGNOSIS — M47812 Spondylosis without myelopathy or radiculopathy, cervical region: Secondary | ICD-10-CM | POA: Diagnosis not present

## 2018-03-20 DIAGNOSIS — M50323 Other cervical disc degeneration at C6-C7 level: Secondary | ICD-10-CM | POA: Diagnosis not present

## 2018-03-20 DIAGNOSIS — M5441 Lumbago with sciatica, right side: Principal | ICD-10-CM

## 2018-03-20 DIAGNOSIS — M542 Cervicalgia: Secondary | ICD-10-CM | POA: Diagnosis not present

## 2018-03-20 DIAGNOSIS — E118 Type 2 diabetes mellitus with unspecified complications: Secondary | ICD-10-CM

## 2018-03-20 DIAGNOSIS — G894 Chronic pain syndrome: Secondary | ICD-10-CM

## 2018-03-20 DIAGNOSIS — M47898 Other spondylosis, sacral and sacrococcygeal region: Secondary | ICD-10-CM | POA: Diagnosis not present

## 2018-03-20 DIAGNOSIS — M5030 Other cervical disc degeneration, unspecified cervical region: Secondary | ICD-10-CM | POA: Diagnosis not present

## 2018-03-20 NOTE — Progress Notes (Signed)
Patient's Name: Colleen Andrade  MRN: [id number]  Referring Provider: Donald Prose, MD  DOB: 12/22/1955  PCP: Donald Prose, MD  DOS: 03/20/2018  Note by: Gillis Santa, MD  Service setting: Ambulatory outpatient  Specialty: Interventional Pain Management  Location: [facility] (AMB) Pain Management Facility  Visit type: Initial Patient Evaluation  Patient type: New Patient   Primary Reason(s) for Visit: Encounter for initial evaluation of one or more chronic problems (new to examiner) potentially causing chronic pain, and posing a threat to normal musculoskeletal function. (Level of risk: High) CC: Back Pain (RUQ) and Hand Pain (right)  HPI  Colleen Andrade is a 62 y.o. year old, female patient, who comes today to see [place] for the first time for an initial evaluation of her chronic pain. She has Cervical radiculopathy; Cervicalgia; Degenerative disc disease, cervical; Chronic right-sided low back pain with right-sided sciatica; Chronic right SI joint pain; and Chronic pain syndrome on their problem list. Today she comes in for evaluation of her Back Pain (RUQ) and Hand Pain (right)  Pain Assessment: Location: Mid(RUQ) Back Radiating: to right hip Onset: More than a month ago Duration: Chronic pain Quality: Constant, Nagging, Throbbing Severity: 6 /10 (subjective, self-reported pain score)  Note: Reported level is inconsistent with clinical observations. Clinically the patient looks like a 2/10 A 2/10 is viewed as "Mild to Moderate" and described as noticeable and distracting. Impossible to hide from other people. More frequent flare-ups. Still possible to adapt and function close to normal. It can be very annoying and may have occasional stronger flare-ups. With discipline, patients may get used to it and adapt. Information on the proper use of the pain scale provided to the patient today. When using our objective Pain Scale, levels between 6 and 10/10 are said to belong in an emergency room, as it progressively  worsens from a 6/10, described as severely limiting, requiring emergency care not usually available at an outpatient pain management facility. At a 6/10 level, communication becomes difficult and requires great effort. Assistance to reach the emergency department may be required. Facial flushing and profuse sweating along with potentially dangerous increases in heart rate and blood pressure will be evident. Effect on ADL: pt states she has taught herself to sleep standing up; states she has not slept well for 10 years Timing: Constant Modifying factors: meds, walking sometimes help BP: 127/73  HR: 66  Onset and Duration: Present longer than 3 months Cause of pain: Unknown Severity: Getting worse, No change since onset, NAS-11 at its worse: 10/10, NAS-11 at its best: 3/10 and NAS-11 now: 7/10 Timing: Not influenced by the time of the day Aggravating Factors: none noted Alleviating Factors: Stretching, Lying down, Medications, Sleeping and TENS Associated Problems: Depression, Numbness, Spasms, Tingling, Pain that wakes patient up and Pain that does not allow patient to sleep Quality of Pain: Disabling, Distressing, Exhausting, Getting longer, Horrible and Itching Previous Examinations or Tests: Nerve conduction test and Orthopedic evaluation Previous Treatments: Epidural steroid injections, Narcotic medications, Physical Therapy and TENS  The patient comes into the clinics today for the first time for a chronic pain management evaluation.   62 year old female with a chief complaint of neck pain that radiates down to her right upper extremity primarily her right hand.  Patient has not had any cervical spine imaging since 2015.  [facility]  There she received a cervical epidural steroid injection which helped her a moderate extent.  She has had prior  cervical epidural steroid injections however her neck pain that radiates into her arm is  getting worse.  Her previous pain physician try to order an MRI but it was declined due to insurance reasons.  She is currently taking hydrocodone 5 mg twice daily along with Lyrica 75 mg 3 times a day.  Patient is also having pain in her lower back along with her right leg and right buttock area.  She denies any bowel or bladder dysfunction.  Patient's insurance has changed and she needs to transfer within network.  Today I took the time to provide the patient with information regarding my pain practice. The patient was informed that my practice is divided into two sections: an interventional pain management section, as well as a completely separate and distinct medication management section. I explained that I have procedure days for my interventional therapies, and evaluation days for follow-ups and medication management. Because of the amount of documentation required during both, they are kept separated. This means that there is the possibility that she may be scheduled for a procedure on one day, and medication management the next. I have also informed her that because of staffing and facility limitations, I no longer take patients for medication management only. To illustrate the reasons for this, I gave the patient the example of surgeons, and how inappropriate it would be to refer a patient to his/her care, just to write for the post-surgical antibiotics on a surgery done by a different surgeon.   Because interventional pain management is my board-certified specialty, the patient was informed that joining my practice means that they are open to any and all interventional therapies. I made it clear that this does not mean that they will be forced to have any procedures done. What this means is that I believe interventional therapies to be essential part of the diagnosis and proper management of chronic pain conditions. Therefore, patients not interested in these interventional alternatives will be better  served under the care of a different practitioner.  The patient was also made aware of my Comprehensive Pain Management Safety Guidelines where by joining my practice, they limit all of their nerve blocks and joint injections to those done by our practice, for as long as we are retained to manage their care.   Historic Controlled Substance Pharmacotherapy Review  PMP and historical list of controlled substances: Hydrocodone 10 mg, quantity 30, last fill 03/04/2018 MME/day: 10 mg/day Medications: The patient did not bring the medication(s) to the appointment, as requested in our "New Patient Package" Pharmacodynamics: Desired effects: Analgesia: The patient reports >50% benefit. Reported improvement in function: The patient reports medication allows her to accomplish basic ADLs. Clinically meaningful improvement in function (CMIF): Sustained CMIF goals met Perceived effectiveness: Described as relatively effective, allowing for increase in activities of daily living (ADL) Undesirable effects: Side-effects or Adverse reactions: None reported Historical Monitoring: The patient  reports that she does not use drugs. List of all UDS Test(s): No results found for: MDMA, COCAINSCRNUR, [place], [place], CANNABQUANT, [place], [place] List of other Serum/Urine Drug Screening Test(s):  No results found for: AMPHSCRSER, BARBSCRSER, BENZOSCRSER, COCAINSCRSER, COCAINSCRNUR, PCPSCRSER, PCPQUANT, THCSCRSER, THCU, CANNABQUANT, OPIATESCRSER, OXYSCRSER, PROPOXSCRSER, ETH Historical Background Evaluation: [place] PMP: Six (6) year initial data search conducted.            * [place] Department of public safety, offender search: Editor, commissioning Information) Non-contributory Risk Assessment Profile: Aberrant behavior: None observed or detected today Risk factors for fatal opioid overdose: None identified today Fatal  overdose hazard ratio (HR): Calculation deferred Non-fatal overdose hazard ratio (HR): Calculation deferred Risk of  opioid abuse or dependence: 0.7-3.0% with doses ? 36 MME/day and 6.1-26% with doses ? 120 MME/day. Substance use disorder (SUD) risk level: See below Personal History of Substance Abuse (SUD-Substance use disorder):  Alcohol: Negative  Illegal Drugs: Negative  Rx Drugs: Negative  ORT Risk Level calculation: Low Risk Opioid Risk Tool - 03/20/18 1436      Family History of Substance Abuse   Alcohol  Negative    Illegal Drugs  Negative    Rx Drugs  Negative      Personal History of Substance Abuse   Alcohol  Negative    Illegal Drugs  Negative    Rx Drugs  Negative      Age   Age between 38-45 years   No      Psychological Disease   Psychological Disease  Negative    Depression  Positive      Total Score   Opioid Risk Tool Scoring  1    Opioid Risk Interpretation  Low Risk      ORT Scoring interpretation table:  Score <3 = Low Risk for SUD  Score between 4-7 = Moderate Risk for SUD  Score >8 = High Risk for Opioid Abuse   PHQ-2 Depression Scale:  Total score: 0  PHQ-2 Scoring interpretation table: (Score and probability of major depressive disorder)  Score 0 = No depression  Score 1 = 15.4% Probability  Score 2 = 21.1% Probability  Score 3 = 38.4% Probability  Score 4 = 45.5% Probability  Score 5 = 56.4% Probability  Score 6 = 78.6% Probability   PHQ-9 Depression Scale:  Total score: 0  PHQ-9 Scoring interpretation table:  Score 0-4 = No depression  Score 5-9 = Mild depression  Score 10-14 = Moderate depression  Score 15-19 = Moderately severe depression  Score 20-27 = Severe depression (2.4 times higher risk of SUD and 2.89 times higher risk of overuse)   Pharmacologic Plan: As per protocol, I have not taken over any controlled substance management, pending the results of ordered tests and/or consults.            Initial impression: Pending review of available data and ordered tests.  Meds   Current Outpatient Medications:  .  DULoxetine (CYMBALTA) 60 MG  capsule, Take 60 mg by mouth daily., Disp: , Rfl:  .  HYDROcodone-acetaminophen (NORCO) 10-325 MG tablet, Take 1 tablet by mouth daily., Disp: , Rfl:  .  meloxicam (MOBIC) 15 MG tablet, Take 1 tablet (15 mg total) by mouth daily. (Patient taking differently: Take 7.5 mg by mouth daily. ), Disp: 15 tablet, Rfl: 0 .  metFORMIN (GLUCOPHAGE) 1000 MG tablet, Take 1,000 mg by mouth 2 (two) times daily with a meal., Disp: , Rfl:  .  pregabalin (LYRICA) 75 MG capsule, Take 75 mg by mouth 3 (three) times daily., Disp: , Rfl:  .  sitaGLIPtin (JANUVIA) 100 MG tablet, Take 100 mg by mouth daily., Disp: , Rfl:  .  tiZANidine (ZANAFLEX) 2 MG tablet, Take by mouth 2 (two) times daily as needed for muscle spasms., Disp: , Rfl:  .  predniSONE (DELTASONE) 20 MG tablet, Take 3 tablets (60 mg total) by mouth daily. (Patient not taking: Reported on 03/20/2018), Disp: 18 tablet, Rfl: 0  Imaging Review  Ankle-L DG Complete:  Results for orders placed during the hospital encounter of 09/24/16  DG Ankle Complete Left   Narrative  CLINICAL DATA:  Left ankle pain after a fall  EXAM: LEFT ANKLE COMPLETE - 3+ VIEW  COMPARISON:  None.  FINDINGS: No fracture or malalignment. Minimal spurring medially. Small plantar calcaneal spur  IMPRESSION: 1. No acute osseous abnormality 2. Small plantar calcaneal spur   Electronically Signed   By: Donavan Foil M.D.   On: 09/24/2016 19:30     Foot Imaging: Foot-R DG Complete: No results found for this or any previous visit. Foot-L DG Complete:  Results for orders placed in visit on 10/08/16  DG Foot Complete Left   Narrative CLINICAL DATA:  Severe left foot pain in the region of the metatarsals, greater laterally, since a fall 2 weeks ago.  EXAM: LEFT FOOT - COMPLETE 3+ VIEW  COMPARISON:  Left ankle radiographs dated 09/24/2016.  FINDINGS: Flattening of the normal plantar arch. No fracture or dislocation. Mid mild inferior and posterior calcaneal spur formation.  Mild posterior talotibial spur formation.  IMPRESSION: 1. No fracture. 2. Pes planus. 3. Mild degenerative changes.   Electronically Signed   By: Claudie Revering M.D.   On: 10/08/2016 16:56     Complexity Note: Imaging results reviewed. Results shared with Colleen Andrade, using Layman's terms.                         ROS  Cardiovascular: High blood pressure Pulmonary or Respiratory: Wheezing and difficulty taking a deep full breath (Asthma) and Snoring  Neurological: No reported neurological signs or symptoms such as seizures, abnormal skin sensations, urinary and/or fecal incontinence, being born with an abnormal open spine and/or a tethered spinal cord Review of Past Neurological Studies: No results found for this or any previous visit. Psychological-Psychiatric: Depressed Gastrointestinal: No reported gastrointestinal signs or symptoms such as vomiting or evacuating blood, reflux, heartburn, alternating episodes of diarrhea and constipation, inflamed or scarred liver, or pancreas or irrregular and/or infrequent bowel movements Genitourinary: No reported renal or genitourinary signs or symptoms such as difficulty voiding or producing urine, peeing blood, non-functioning kidney, kidney stones, difficulty emptying the bladder, difficulty controlling the flow of urine, or chronic kidney disease Hematological: No reported hematological signs or symptoms such as prolonged bleeding, low or poor functioning platelets, bruising or bleeding easily, hereditary bleeding problems, low energy levels due to low hemoglobin or being anemic Endocrine: diabetes Rheumatologic: No reported rheumatological signs and symptoms such as fatigue, joint pain, tenderness, swelling, redness, heat, stiffness, decreased range of motion, with or without associated rash Musculoskeletal: Negative for myasthenia gravis, muscular dystrophy, multiple sclerosis or malignant hyperthermia Work History: Retired  Allergies   Colleen Andrade has No Known Allergies.  Laboratory Chemistry  Inflammation Markers (CRP: Acute Phase) (ESR: Chronic Phase) No results found for: CRP, ESRSEDRATE, LATICACIDVEN                       Rheumatology Markers No results found for: RF, ANA, LABURIC, URICUR, LYMEIGGIGMAB, LYMEABIGMQN, HLAB27                      Renal Function Markers Lab Results  Component Value Date   BUN 20 01/26/2018   CREATININE 0.75 01/26/2018   GFRAA >60 01/26/2018   GFRNONAA >60 01/26/2018                             Hepatic Function Markers Lab Results  Component Value Date   AST 45 (H) 01/26/2018  ALT 53 (H) 01/26/2018   ALBUMIN 4.0 01/26/2018   ALKPHOS 67 01/26/2018                        Electrolytes Lab Results  Component Value Date   NA 138 01/26/2018   K 3.8 01/26/2018   CL 107 01/26/2018   CALCIUM 9.0 01/26/2018                        Neuropathy Markers No results found for: VITAMINB12, FOLATE, HGBA1C, HIV                      CNS Tests No results found for: COLORCSF, APPEARCSF, RBCCOUNTCSF, WBCCSF, POLYSCSF, LYMPHSCSF, EOSCSF, PROTEINCSF, GLUCCSF, JCVIRUS, CSFOLI, IGGCSF                      Bone Pathology Markers No results found for: VD25OH, [id number], [id number], [id number], 25OHVITD1, 25OHVITD2, 25OHVITD3, TESTOFREE, TESTOSTERONE                       Coagulation Parameters Lab Results  Component Value Date   INR 1.04 01/26/2018   LABPROT 13.5 01/26/2018   APTT 31 01/26/2018   PLT 263 01/26/2018                        Cardiovascular Markers Lab Results  Component Value Date   TROPONINI <0.03 01/26/2018   HGB 13.5 01/26/2018   HCT 40.2 01/26/2018                         CA Markers No results found for: CEA, CA125, LABCA2                      Note: Lab results reviewed.  [place]  Drug: Colleen Andrade  reports that she does not use drugs. Alcohol:  reports that she does not drink alcohol. Tobacco:  reports that she has been smoking cigarettes. She has never  used smokeless tobacco. Medical:  has a past medical history of Asthma and Diabetes mellitus without complication ([place]). Family: family history is not on file.  Past Surgical History:  Procedure Laterality Date  . CHOLECYSTECTOMY     Active Ambulatory Problems    Diagnosis Date Noted  . Cervical radiculopathy 03/21/2018  . Cervicalgia 03/21/2018  . Degenerative disc disease, cervical 03/21/2018  . Chronic right-sided low back pain with right-sided sciatica 03/21/2018  . Chronic right SI joint pain 03/21/2018  . Chronic pain syndrome 03/21/2018   Resolved Ambulatory Problems    Diagnosis Date Noted  . No Resolved Ambulatory Problems   Past Medical History:  Diagnosis Date  . Asthma   . Diabetes mellitus without complication ([place])    Constitutional Exam  General appearance: Well nourished, well developed, and well hydrated. In no apparent acute distress Vitals:   03/20/18 1421  BP: 127/73  Pulse: 66  Resp: 16  Temp: 98.2 F (36.8 C)  TempSrc: Oral  SpO2: 100%  Weight: 239 lb (108.4 kg)  Height: _0  (1.676 m)   BMI Assessment: Estimated body mass index is 38.58 kg/m as calculated from the following:   Height as of this encounter: _1  (1.676 m).   Weight as of this encounter: 239 lb (108.4 kg).  BMI interpretation table: BMI level Category Range association with higher incidence of chronic pain  <  18 kg/m2 Underweight   18.5-24.9 kg/m2 Ideal body weight   25-29.9 kg/m2 Overweight Increased incidence by 20%  30-34.9 kg/m2 Obese (Class I) Increased incidence by 68%  35-39.9 kg/m2 Severe obesity (Class II) Increased incidence by 136%  >40 kg/m2 Extreme obesity (Class III) Increased incidence by 254%   Patient's current BMI Ideal Body weight  Body mass index is 38.58 kg/m. Ideal body weight: 59.3 kg (130 lb 11.7 oz) Adjusted ideal body weight: 78.9 kg (174 lb 0.6 oz)   BMI Readings from Last 4 Encounters:  03/20/18 38.58 kg/m  01/26/18 37.93 kg/m  10/27/16  38.74 kg/m  10/08/16 38.45 kg/m   Wt Readings from Last 4 Encounters:  03/20/18 239 lb (108.4 kg)  01/26/18 235 lb (106.6 kg)  10/27/16 240 lb (108.9 kg)  10/08/16 238 lb 3.2 oz (108 kg)  Psych/Mental status: Alert, oriented x 3 (person, place, & time)       Eyes: PERLA Respiratory: No evidence of acute respiratory distress  Cervical Spine Area Exam  Skin & Axial Inspection: No masses, redness, edema, swelling, or associated skin lesions Alignment: Symmetrical Functional ROM: Unrestricted ROM      Stability: No instability detected Muscle Tone/Strength: Functionally intact. No obvious neuro-muscular anomalies detected. Sensory (Neurological): Dermatomal pain pattern Palpation: No palpable anomalies              Upper Extremity (UE) Exam    Side: Right upper extremity  Side: Left upper extremity  Skin & Extremity Inspection: Skin color, temperature, and hair growth are WNL. No peripheral edema or cyanosis. No masses, redness, swelling, asymmetry, or associated skin lesions. No contractures.  Skin & Extremity Inspection: Skin color, temperature, and hair growth are WNL. No peripheral edema or cyanosis. No masses, redness, swelling, asymmetry, or associated skin lesions. No contractures.  Functional ROM: Unrestricted ROM          Functional ROM: Unrestricted ROM          Muscle Tone/Strength: Functionally intact. No obvious neuro-muscular anomalies detected.  Muscle Tone/Strength: Functionally intact. No obvious neuro-muscular anomalies detected.  Sensory (Neurological): Dermatomal pain pattern          Sensory (Neurological): Unimpaired          Palpation: No palpable anomalies              Palpation: No palpable anomalies              Provocative Test(s):  Phalen's test: deferred Tinel's test: deferred Apley's scratch test (touch opposite shoulder):  Action 1 (Across chest): deferred Action 2 (Overhead): deferred Action 3 (LB reach): deferred   Provocative Test(s):  Phalen's  test: deferred Tinel's test: deferred Apley's scratch test (touch opposite shoulder):  Action 1 (Across chest): deferred Action 2 (Overhead): deferred Action 3 (LB reach): deferred   5 out of 5 strength bilateral upper extremity: Shoulder abduction, elbow flexion, elbow extension, thumb extension.  Thoracic Spine Area Exam  Skin & Axial Inspection: No masses, redness, or swelling Alignment: Symmetrical Functional ROM: Unrestricted ROM Stability: No instability detected Muscle Tone/Strength: Functionally intact. No obvious neuro-muscular anomalies detected. Sensory (Neurological): Unimpaired Muscle strength & Tone: No palpable anomalies  Lumbar Spine Area Exam  Skin & Axial Inspection: No masses, redness, or swelling Alignment: Symmetrical Functional ROM: Unrestricted ROM       Stability: No instability detected Muscle Tone/Strength: Functionally intact. No obvious neuro-muscular anomalies detected. Sensory (Neurological): Dermatomal pain pattern and musculoskeletal Palpation: No palpable anomalies       Provocative Tests:  Hyperextension/rotation test: (+) on the right for facet joint pain. Lumbar quadrant test (Kemp's test): (+) on the right for foraminal stenosis Lateral bending test: deferred today       Patrick's Maneuver: deferred today                   FABER test: deferred today                   S-I anterior distraction/compression test: deferred today         S-I lateral compression test: deferred today         S-I Thigh-thrust test: deferred today         S-I Gaenslen's test: deferred today          Gait & Posture Assessment  Ambulation: Unassisted Gait: Relatively normal for age and body habitus Posture: WNL   Lower Extremity Exam    Side: Right lower extremity  Side: Left lower extremity  Stability: No instability observed          Stability: No instability observed          Skin & Extremity Inspection: Skin color, temperature, and hair growth are WNL. No  peripheral edema or cyanosis. No masses, redness, swelling, asymmetry, or associated skin lesions. No contractures.  Skin & Extremity Inspection: Skin color, temperature, and hair growth are WNL. No peripheral edema or cyanosis. No masses, redness, swelling, asymmetry, or associated skin lesions. No contractures.  Functional ROM: Decreased ROM for hip joint          Functional ROM: Unrestricted ROM                  Muscle Tone/Strength: Functionally intact. No obvious neuro-muscular anomalies detected.  Muscle Tone/Strength: Functionally intact. No obvious neuro-muscular anomalies detected.  Sensory (Neurological): Musculoskeletal pain pattern  Sensory (Neurological): Unimpaired  Palpation: No palpable anomalies  Palpation: No palpable anomalies   Assessment  Primary Diagnosis & Pertinent Problem List: The primary encounter diagnosis was Cervical radiculopathy. Diagnoses of Cervicalgia, Degenerative disc disease, cervical, Chronic right-sided low back pain with right-sided sciatica, Chronic right SI joint pain, and Chronic pain syndrome were also pertinent to this visit.  Visit Diagnosis (New problems to examiner): 1. Cervical radiculopathy   2. Cervicalgia   3. Degenerative disc disease, cervical   4. Chronic right-sided low back pain with right-sided sciatica   5. Chronic right SI joint pain   6. Chronic pain syndrome    General Recommendations: The pain condition that the patient suffers from is best treated with a multidisciplinary approach that involves an increase in physical activity to prevent de-conditioning and worsening of the pain cycle, as well as psychological counseling (formal and/or informal) to address the co-morbid psychological affects of pain. Treatment will often involve judicious use of pain medications and interventional procedures to decrease the pain, allowing the patient to participate in the physical activity that will ultimately produce long-lasting pain reductions.  The goal of the multidisciplinary approach is to return the patient to a higher level of overall function and to restore their ability to perform activities of daily living.  62 year old female with a chief complaint of neck pain that radiates down to her right upper extremity primarily her right hand.  Patient has not had any cervical spine imaging since 2015.  [facility]  There she received a cervical epidural steroid injection which helped her a moderate extent.  She has had  prior cervical epidural steroid injections however her neck pain that radiates into her arm is getting worse.  Her previous pain physician try to order an MRI but it was declined due to insurance reasons.  She is currently taking hydrocodone 5 mg twice daily along with Lyrica 75 mg 3 times a day.  Patient is also having pain in her lower back along with her right leg and right buttock area.  She denies any bowel or bladder dysfunction.  Patient's insurance has changed and she needs to transfer within network.  Plan: -UDS today.  If appropriate can take of regimen of hydrocodone 5 mg twice daily as needed. -Xrays as below to evaluate neck pain, low back pain, and right hip/GTB pain-may need to follow-up neck with CT without con or cervical MRI. -Continue multimodal regimen of Lyrica 75 mg 3 times daily, Cymbalta 60 mg daily, tizanidine 2 mg nightly as needed, meloxicam 7.5 mg daily.  Note: Please be advised that as per protocol, today's visit has been an evaluation only. We have not taken over the patient's controlled substance management.  Ordered Lab-work, Procedure(s), Referral(s), & Consult(s): Orders Placed This Encounter  Procedures  . DG Lumbar Spine Complete W/Bend  . DG Si Joints  . DG Cervical Spine With Flex & Extend  . Compliance Drug Analysis, Ur   Pharmacotherapy (current): Medications ordered:  No orders of the defined types were placed in this  encounter.  Medications administered during this visit: Colleen Andrade had no medications administered during this visit.   Pharmacological management options:  Opioid Analgesics: The patient was informed that there is no guarantee that she would be a candidate for opioid analgesics. The decision will be made following CDC guidelines. This decision will be based on the results of diagnostic studies, as well as Colleen Andrade's risk profile.   Membrane stabilizer: To be determined at a later time  Muscle relaxant: To be determined at a later time  NSAID: To be determined at a later time  Other analgesic(s): To be determined at a later time   Interventional management options: Colleen Andrade was informed that there is no guarantee that she would be a candidate for interventional therapies. The decision will be based on the results of diagnostic studies, as well as Colleen Andrade's risk profile.  Procedure(s) under consideration:  Cervical epidural steroid injection Cervical facet medial branch nerve blocks, possible cervical RFA Lumbar epidural steroid injection Lumbar facet medial branch nerve blocks SI joint injection   Provider-requested follow-up: Return in about 10 days (around 03/30/2018) for Medication Management, After Imaging.  Future Appointments  Date Time Provider [place]  03/30/2018  8:30 AM Gillis Santa, MD [facility] None    Primary Care Physician: Donald Prose, MD Location: [facility] Note by: Gillis Santa, M.D, Date: 03/20/2018; Time: 8:17 AM  Patient Instructions  You will have XRays and UDS completed prior to next appt. with pain clinic.

## 2018-03-20 NOTE — Progress Notes (Signed)
Safety precautions to be maintained throughout the outpatient stay will include: orient to surroundings, keep bed in low position, maintain call bell within reach at all times, provide assistance with transfer out of bed and ambulation.  

## 2018-03-20 NOTE — Patient Instructions (Signed)
You will have XRays and UDS completed prior to next appt. with pain clinic.

## 2018-03-21 DIAGNOSIS — M5412 Radiculopathy, cervical region: Secondary | ICD-10-CM | POA: Insufficient documentation

## 2018-03-21 DIAGNOSIS — M5441 Lumbago with sciatica, right side: Secondary | ICD-10-CM

## 2018-03-21 DIAGNOSIS — G894 Chronic pain syndrome: Secondary | ICD-10-CM | POA: Insufficient documentation

## 2018-03-21 DIAGNOSIS — M533 Sacrococcygeal disorders, not elsewhere classified: Secondary | ICD-10-CM

## 2018-03-21 DIAGNOSIS — M5030 Other cervical disc degeneration, unspecified cervical region: Secondary | ICD-10-CM | POA: Insufficient documentation

## 2018-03-21 DIAGNOSIS — G8929 Other chronic pain: Secondary | ICD-10-CM | POA: Insufficient documentation

## 2018-03-21 DIAGNOSIS — M542 Cervicalgia: Secondary | ICD-10-CM | POA: Insufficient documentation

## 2018-03-25 LAB — COMPLIANCE DRUG ANALYSIS, UR

## 2018-03-30 ENCOUNTER — Ambulatory Visit: Payer: Self-pay | Admitting: Student in an Organized Health Care Education/Training Program

## 2018-04-04 ENCOUNTER — Encounter: Payer: Self-pay | Admitting: Student in an Organized Health Care Education/Training Program

## 2018-04-04 ENCOUNTER — Other Ambulatory Visit: Payer: Self-pay

## 2018-04-04 ENCOUNTER — Ambulatory Visit
Payer: BLUE CROSS/BLUE SHIELD | Attending: Student in an Organized Health Care Education/Training Program | Admitting: Student in an Organized Health Care Education/Training Program

## 2018-04-04 VITALS — BP 135/59 | HR 69 | Temp 98.0°F | Resp 18 | Ht 66.0 in | Wt 230.0 lb

## 2018-04-04 DIAGNOSIS — M4722 Other spondylosis with radiculopathy, cervical region: Secondary | ICD-10-CM | POA: Insufficient documentation

## 2018-04-04 DIAGNOSIS — G894 Chronic pain syndrome: Secondary | ICD-10-CM | POA: Diagnosis not present

## 2018-04-04 DIAGNOSIS — M5136 Other intervertebral disc degeneration, lumbar region: Secondary | ICD-10-CM | POA: Diagnosis not present

## 2018-04-04 DIAGNOSIS — M5441 Lumbago with sciatica, right side: Secondary | ICD-10-CM | POA: Diagnosis not present

## 2018-04-04 DIAGNOSIS — M533 Sacrococcygeal disorders, not elsewhere classified: Secondary | ICD-10-CM | POA: Diagnosis not present

## 2018-04-04 DIAGNOSIS — M2142 Flat foot [pes planus] (acquired), left foot: Secondary | ICD-10-CM | POA: Insufficient documentation

## 2018-04-04 DIAGNOSIS — M542 Cervicalgia: Secondary | ICD-10-CM | POA: Insufficient documentation

## 2018-04-04 DIAGNOSIS — F17210 Nicotine dependence, cigarettes, uncomplicated: Secondary | ICD-10-CM | POA: Insufficient documentation

## 2018-04-04 DIAGNOSIS — M5010 Cervical disc disorder with radiculopathy, unspecified cervical region: Secondary | ICD-10-CM | POA: Diagnosis not present

## 2018-04-04 DIAGNOSIS — E119 Type 2 diabetes mellitus without complications: Secondary | ICD-10-CM | POA: Diagnosis not present

## 2018-04-04 DIAGNOSIS — M47816 Spondylosis without myelopathy or radiculopathy, lumbar region: Secondary | ICD-10-CM | POA: Diagnosis not present

## 2018-04-04 DIAGNOSIS — Z7984 Long term (current) use of oral hypoglycemic drugs: Secondary | ICD-10-CM | POA: Insufficient documentation

## 2018-04-04 DIAGNOSIS — G8929 Other chronic pain: Secondary | ICD-10-CM

## 2018-04-04 DIAGNOSIS — J45909 Unspecified asthma, uncomplicated: Secondary | ICD-10-CM | POA: Diagnosis not present

## 2018-04-04 DIAGNOSIS — M47812 Spondylosis without myelopathy or radiculopathy, cervical region: Secondary | ICD-10-CM | POA: Diagnosis not present

## 2018-04-04 DIAGNOSIS — M19072 Primary osteoarthritis, left ankle and foot: Secondary | ICD-10-CM | POA: Diagnosis not present

## 2018-04-04 DIAGNOSIS — M545 Low back pain: Secondary | ICD-10-CM

## 2018-04-04 DIAGNOSIS — M5459 Other low back pain: Secondary | ICD-10-CM

## 2018-04-04 DIAGNOSIS — Z79899 Other long term (current) drug therapy: Secondary | ICD-10-CM | POA: Diagnosis not present

## 2018-04-04 DIAGNOSIS — M5030 Other cervical disc degeneration, unspecified cervical region: Secondary | ICD-10-CM

## 2018-04-04 DIAGNOSIS — M5412 Radiculopathy, cervical region: Secondary | ICD-10-CM

## 2018-04-04 MED ORDER — PREGABALIN 75 MG PO CAPS: 75.0000 mg | ORAL_CAPSULE | Freq: Three times a day (TID) | ORAL | 3 refills | Status: DC

## 2018-04-04 MED ORDER — TIZANIDINE HCL 2 MG PO TABS: 2.0000 mg | ORAL_TABLET | Freq: Two times a day (BID) | ORAL | 3 refills | Status: DC | PRN

## 2018-04-04 MED ORDER — HYDROCODONE-ACETAMINOPHEN 10-325 MG PO TABS: 1.0000 | ORAL_TABLET | Freq: Every day | ORAL | 0 refills | Status: AC

## 2018-04-04 MED ORDER — HYDROCODONE-ACETAMINOPHEN 10-325 MG PO TABS: 1.0000 | ORAL_TABLET | Freq: Every day | ORAL | 0 refills | Status: AC | PRN

## 2018-04-04 MED ORDER — DULOXETINE HCL 60 MG PO CPEP: 60.0000 mg | ORAL_CAPSULE | Freq: Every day | ORAL | 3 refills | Status: DC

## 2018-04-04 NOTE — Patient Instructions (Addendum)
____________________________________________________________________________________________  Preparing for Procedure with Sedation  Instructions: . Oral Intake: Do not eat or drink anything for at least 8 hours prior to your procedure. . Transportation: Public transportation is not allowed. Bring an adult driver. The driver must be physically present in our waiting room before any procedure can be started. . Physical Assistance: Bring an adult physically capable of assisting you, in the event you need help. This adult should keep you company at home for at least 6 hours after the procedure. . Blood Pressure Medicine: Take your blood pressure medicine with a sip of water the morning of the procedure. . Blood thinners: Notify our staff if you are taking any blood thinners. Depending on which one you take, there will be specific instructions on how and when to stop it. . Diabetics on insulin: Notify the staff so that you can be scheduled 1st case in the morning. If your diabetes requires high dose insulin, take only  of your normal insulin dose the morning of the procedure and notify the staff that you have done so. . Preventing infections: Shower with an antibacterial soap the morning of your procedure. . Build-up your immune system: Take 1000 mg of Vitamin C with every meal (3 times a day) the day prior to your procedure. . Antibiotics: Inform the staff if you have a condition or reason that requires you to take antibiotics before dental procedures. . Pregnancy: If you are pregnant, call and cancel the procedure. . Sickness: If you have a cold, fever, or any active infections, call and cancel the procedure. . Arrival: You must be in the facility at least 30 minutes prior to your scheduled procedure. . Children: Do not bring children with you. . Dress appropriately: Bring dark clothing that you would not mind if they get stained. . Valuables: Do not bring any jewelry or valuables.  Procedure  appointments are reserved for interventional treatments only. . No Prescription Refills. . No medication changes will be discussed during procedure appointments. . No disability issues will be discussed.  Reasons to call and reschedule or cancel your procedure: (Following these recommendations will minimize the risk of a serious complication.) . Surgeries: Avoid having procedures within 2 weeks of any surgery. (Avoid for 2 weeks before or after any surgery). . Flu Shots: Avoid having procedures within 2 weeks of a flu shots or . (Avoid for 2 weeks before or after immunizations). . Barium: Avoid having a procedure within 7-10 days after having had a radiological study involving the use of radiological contrast. (Myelograms, Barium swallow or enema study). . Heart attacks: Avoid any elective procedures or surgeries for the initial 6 months after a "Myocardial Infarction" (Heart Attack). . Blood thinners: It is imperative that you stop these medications before procedures. Let us know if you if you take any blood thinner.  . Infection: Avoid procedures during or within two weeks of an infection (including chest colds or gastrointestinal problems). Symptoms associated with infections include: Localized redness, fever, chills, night sweats or profuse sweating, burning sensation when voiding, cough, congestion, stuffiness, runny nose, sore throat, diarrhea, nausea, vomiting, cold or Flu symptoms, recent or current infections. It is specially important if the infection is over the area that we intend to treat. . Heart and lung problems: Symptoms that may suggest an active cardiopulmonary problem include: cough, chest pain, breathing difficulties or shortness of breath, dizziness, ankle swelling, uncontrolled high or unusually low blood pressure, and/or palpitations. If you are experiencing any of these symptoms, cancel   your procedure and contact your primary care physician for an evaluation.  Remember:   Regular Business hours are:  Monday to Thursday 8:00 AM to 4:00 PM  Provider's Schedule: Delano Metz, MD:  Procedure days: Tuesday and Thursday 7:30 AM to 4:00 PM  Edward Jolly, MD:  Procedure days: Monday and Wednesday 7:30 AM to 4:00 PM ____________________________________________________________________________________________ Prescriptions for Hydrocodone, Tizanidine, Cymbalta, and Lyrica were sent to your pharmacy.

## 2018-04-04 NOTE — Progress Notes (Signed)
Safety precautions to be maintained throughout the outpatient stay will include: orient to surroundings, keep bed in low position, maintain call bell within reach at all times, provide assistance with transfer out of bed and ambulation.  

## 2018-04-04 NOTE — Progress Notes (Signed)
Patient's Name: Colleen Andrade  MRN: [id number]  Referring Provider: Donald Prose, MD  DOB: 1956/02/14  PCP: Donald Prose, MD  DOS: 04/04/2018  Note by: Gillis Santa, MD  Service setting: Ambulatory outpatient  Specialty: Interventional Pain Management  Location: [facility] (AMB) Pain Management Facility    Patient type: Established   Primary Reason(s) for Visit: Encounter for evaluation before starting new chronic pain management plan of care (Level of risk: moderate) CC: Neck Pain  HPI  Colleen Andrade is a 62 y.o. year old, female patient, who comes today for a follow-up evaluation to review the test results and decide on a treatment plan. She has Cervical radiculopathy; Cervicalgia; Degenerative disc disease, cervical; Chronic right-sided low back pain with right-sided sciatica; Chronic right SI joint pain; and Chronic pain syndrome on their problem list. Her primarily concern today is the Neck Pain  Pain Assessment: Location: Right Neck Radiating: right arm Onset: More than a month ago Duration: Chronic pain Quality: Sore, Tender Severity: 4 /10 (subjective, self-reported pain score)  Note: Reported level is compatible with observation.                         When using our objective Pain Scale, levels between 6 and 10/10 are said to belong in an emergency room, as it progressively worsens from a 6/10, described as severely limiting, requiring emergency care not usually available at an outpatient pain management facility. At a 6/10 level, communication becomes difficult and requires great effort. Assistance to reach the emergency department may be required. Facial flushing and profuse sweating along with potentially dangerous increases in heart rate and blood pressure will be evident. Effect on ADL:   Timing: Constant Modifying factors: walking, rest BP: (!) 135/59  HR: 69  Colleen Andrade comes in today for a follow-up visit after her initial evaluation on 03/30/2018. Today we went over the  results of her tests. These were explained in "Layman's terms". During today's appointment we went over my diagnostic impression, as well as the proposed treatment plan.  Today we reviewed the patient's UDS Today we reviewed the patient's lumbar spine x-rays, SI joint x-rays, cervical spine x-rays and developed treatment plan as below  In considering the treatment plan options, Colleen Andrade was reminded that I no longer take patients for medication management only. I asked her to let me know if she had no intention of taking advantage of the interventional therapies, so that we could make arrangements to provide this space to someone interested. I also made it clear that undergoing interventional therapies for the purpose of getting pain medications is very inappropriate on the part of a patient, and it will not be tolerated in this practice. This type of behavior would suggest true addiction and therefore it requires referral to an addiction specialist.   Further details on both, my assessment(s), as well as the proposed treatment plan, please see below.  Controlled Substance Pharmacotherapy Assessment REMS (Risk Evaluation and Mitigation Strategy)  Analgesic: Hydrocodone 10 mg daily MME/day: 10 mg/day. Pill Count: None expected due to no prior prescriptions written by our practice. Colleen Martins, RN  04/04/2018  9:08 AM  Sign at close encounter Safety precautions to be maintained throughout the outpatient stay will include: orient to surroundings, keep bed in low position, maintain call bell within reach at all times, provide assistance with transfer out of bed and ambulation.    Pharmacokinetics: Liberation and absorption (onset of action): WNL Distribution (time to peak  effect): WNL Metabolism and excretion (duration of action): WNL         Pharmacodynamics: Desired effects: Analgesia: Colleen Andrade reports >50% benefit. Functional ability: Patient reports that medication allows her to  accomplish basic ADLs Clinically meaningful improvement in function (CMIF): Sustained CMIF goals met Perceived effectiveness: Described as relatively effective, allowing for increase in activities of daily living (ADL) Undesirable effects: Side-effects or Adverse reactions: None reported Monitoring: Colleen Andrade PMP: Online review of the past 18-monthperiod previously conducted. Not applicable at this point since we have not taken over the patient's medication management yet. List of other Serum/Urine Drug Screening Test(s):  No results found for: AMPHSCRSER, BARBSCRSER, BENZOSCRSER, COCAINSCRSER, COCAINSCRNUR, PCPSCRSER, THCSCRSER, THCU, CANNABQUANT, [place] of all UDS test(s) done:  Lab Results  Component Value Date   SUMMARY FINAL 03/20/2018   Last UDS on record: Summary  Date Value Ref Range Status  03/20/2018 FINAL  Final    Comment:    ==================================================================== TOXASSURE COMP DRUG ANALYSIS,UR ==================================================================== Test                             Result       Flag       Units Drug Present and Declared for Prescription Verification   Hydrocodone                    1685         EXPECTED   ng/mg creat   Dihydrocodeine                 116          EXPECTED   ng/mg creat   Norhydrocodone                 1169         EXPECTED   ng/mg creat    Sources of hydrocodone include scheduled prescription    medications. Dihydrocodeine and norhydrocodone are expected    metabolites of hydrocodone. Dihydrocodeine is also available as a    scheduled prescription medication.   Pregabalin                     PRESENT      EXPECTED   Tizanidine                     PRESENT      EXPECTED   Duloxetine                     PRESENT      EXPECTED   Acetaminophen                  PRESENT      EXPECTED ==================================================================== Test                       Result    Flag   Units      Ref Range   Creatinine              219              mg/dL      >=20 ==================================================================== Declared Medications:  The flagging and interpretation on this report are based on the  following declared medications.  Unexpected results may arise from  inaccuracies in the declared medications.  **Note: The testing scope of this panel includes these medications:  Duloxetine  Hydrocodone (Hydrocodone-Acetaminophen)  Pregabalin  **Note: The testing scope of this panel does not include small to  moderate amounts of these reported medications:  Acetaminophen (Hydrocodone-Acetaminophen)  Tizanidine  **Note: The testing scope of this panel does not include following  reported medications:  Meloxicam  Metformin  Prednisone  Sitagliptin ==================================================================== For clinical consultation, please call [phone]. ====================================================================    UDS interpretation: No unexpected findings.          Medication Assessment Form: Patient introduced to form today Treatment compliance: Treatment may start today if patient agrees with proposed plan. Evaluation of compliance is not applicable at this point Risk Assessment Profile: Aberrant behavior: See initial evaluations. None observed or detected today Comorbid factors increasing risk of overdose: See initial evaluation. No additional risks detected today Opioid risk tool (ORT) (Total Score):   Personal History of Substance Abuse (SUD-Substance use disorder):  Alcohol:    Illegal Drugs:    Rx Drugs:    ORT Risk Level calculation:   Risk of substance use disorder (SUD): Low  ORT Scoring interpretation table:  Score <3 = Low Risk for SUD  Score between 4-7 = Moderate Risk for SUD  Score >8 = High Risk for Opioid Abuse   Risk Mitigation Strategies:  Patient opioid safety  counseling: Completed today. Counseling provided to patient as per "Patient Counseling Document". Document signed by patient, attesting to counseling and understanding Patient-Prescriber Agreement (PPA): Obtained today.  Controlled substance notification to other providers: Written and sent today.  Pharmacologic Plan: Today we may be taking over the patient's pharmacological regimen. See below.             Laboratory Chemistry  Inflammation Markers (CRP: Acute Phase) (ESR: Chronic Phase) No results found for: CRP, ESRSEDRATE, LATICACIDVEN                       Rheumatology Markers No results found for: RF, ANA, LABURIC, URICUR, LYMEIGGIGMAB, LYMEABIGMQN, HLAB27                      Renal Function Markers Lab Results  Component Value Date   BUN 20 01/26/2018   CREATININE 0.75 01/26/2018   GFRAA >60 01/26/2018   GFRNONAA >60 01/26/2018                             Hepatic Function Markers Lab Results  Component Value Date   AST 45 (H) 01/26/2018   ALT 53 (H) 01/26/2018   ALBUMIN 4.0 01/26/2018   ALKPHOS 67 01/26/2018                        Electrolytes Lab Results  Component Value Date   NA 138 01/26/2018   K 3.8 01/26/2018   CL 107 01/26/2018   CALCIUM 9.0 01/26/2018                        Neuropathy Markers No results found for: VITAMINB12, FOLATE, HGBA1C, HIV                      CNS Tests No results found for: COLORCSF, APPEARCSF, RBCCOUNTCSF, WBCCSF, POLYSCSF, LYMPHSCSF, EOSCSF, PROTEINCSF, GLUCCSF, JCVIRUS, CSFOLI, IGGCSF                      Bone Pathology Markers No results found for: [place], [id number], [id number],  [id number], 25OHVITD1, 25OHVITD2, 25OHVITD3, TESTOFREE, TESTOSTERONE                       Coagulation Parameters Lab Results  Component Value Date   INR 1.04 01/26/2018   LABPROT 13.5 01/26/2018   APTT 31 01/26/2018   PLT 263 01/26/2018                        Cardiovascular Markers Lab Results  Component Value Date   TROPONINI <0.03  01/26/2018   HGB 13.5 01/26/2018   HCT 40.2 01/26/2018                         CA Markers No results found for: CEA, CA125, LABCA2                      Note: Lab results reviewed.  Recent Diagnostic Imaging Review  Cervical Imaging: Cervical DG Bending/F/E views:  Results for orders placed during the hospital encounter of 03/20/18  DG Cervical Spine With Flex & Extend   Narrative CLINICAL DATA:  Chronic pain, no known injury  EXAM: CERVICAL SPINE COMPLETE WITH FLEXION AND EXTENSION VIEWS  COMPARISON:  None.  FINDINGS: The cervical spine is visualized to the level of T1.  The vertebral body heights are maintained. The prevertebral soft tissues are normal. There is no acute fracture. There is 3 mm anterolisthesis of C6 on C7 secondary to facet disease without improvement or worsening during flexion and extension. There is degenerative disc disease with mild disc height loss at C6-7 and C7-T1. There is bilateral facet arthropathy at C7-T1. The neural foramina are patent bilaterally.  The lung apices are clear.  IMPRESSION: Mild cervical spine spondylosis as described above.   Electronically Signed   By: Kathreen Devoid   On: 03/21/2018 09:04     Lumbar DG Bending views:  Results for orders placed during the hospital encounter of 03/20/18  DG Lumbar Spine Complete W/Bend   Narrative CLINICAL DATA:  Chronic pain, no injury  EXAM: LUMBAR SPINE - COMPLETE WITH BENDING VIEWS  COMPARISON:  None.  FINDINGS: There are 5 nonrib bearing lumbar-type vertebral bodies.  The vertebral body heights are maintained.  There is 6 mm of grade 1 anterolisthesis of L4 on L5 in the neutral position likely secondary to facet disease without significant change during flexion and extension. Otherwise, there is no static listhesis at other levels. There is no dynamic listhesis during flexion and extension at other levels. There is no spondylolysis.  There is no acute  fracture.  There is degenerative disc disease with mild disc height loss at L4-5 and L5-S1. There is degenerative disc disease with disc height loss partially visualized at T9-10, T10-11 and T11-12. There is bilateral facet arthropathy at L4-5 and L5-S1. There are mildly prominent bilateral L5 transverse processes, but they do not appear to articulate with the sacrum nor is there subcortical reactive sclerosis or cystic changes.  IMPRESSION: 1. Lower lumbar spine spondylosis as described above.   Electronically Signed   By: Kathreen Devoid   On: 03/21/2018 08:45     Sacroiliac Joint Imaging: Sacroiliac Joint DG:  Results for orders placed during the hospital encounter of 03/20/18  DG Si Joints   Narrative CLINICAL DATA:  Chronic low back pain  EXAM: BILATERAL SACROILIAC JOINTS - 3+ VIEW  COMPARISON:  None.  FINDINGS: There is mild subchondral sclerosis involving the inferior  right sacroiliac joint. No SI joint widening or erosive changes. There are mildly prominent bilateral L5 transverse processes, but they do not appear to articulate with the sacrum nor is there subcortical reactive sclerosis or cystic changes. There is a small erosion involving the left inferior pubic body at the symphysis as can be seen with osteitis pubis. Bilateral hips demonstrate no focal abnormality. Bilateral hip joint spaces are maintained.  IMPRESSION: 1. 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   Electronically Signed   By: Kathreen Devoid   On: 03/21/2018 08:47     Ankle-L DG Complete:  Results for orders placed during the hospital encounter of 09/24/16  DG Ankle Complete Left   Narrative CLINICAL DATA:  Left ankle pain after a fall  EXAM: LEFT ANKLE COMPLETE - 3+ VIEW  COMPARISON:  None.  FINDINGS: No fracture or malalignment. Minimal spurring medially. Small plantar calcaneal spur  IMPRESSION: 1. No acute osseous abnormality 2. Small plantar calcaneal  spur   Electronically Signed   By: Donavan Foil M.D.   On: 09/24/2016 19:30    Foot-L DG Complete:  Results for orders placed in visit on 10/08/16  DG Foot Complete Left   Narrative CLINICAL DATA:  Severe left foot pain in the region of the metatarsals, greater laterally, since a fall 2 weeks ago.  EXAM: LEFT FOOT - COMPLETE 3+ VIEW  COMPARISON:  Left ankle radiographs dated 09/24/2016.  FINDINGS: Flattening of the normal plantar arch. No fracture or dislocation. Mid mild inferior and posterior calcaneal spur formation. Mild posterior talotibial spur formation.  IMPRESSION: 1. No fracture. 2. Pes planus. 3. Mild degenerative changes.   Electronically Signed   By: Claudie Revering M.D.   On: 10/08/2016 16:56     Complexity Note: Imaging results reviewed. Results shared with Ms. Shawley, using Layman's terms. Today I personally and independently reviewed the study images pertinent to Ms. Massing's problem.            I have personally examined the images and I agree with the reported  findings. I find no additional pain-related pathology to add to the report.  Meds   Current Outpatient Medications:  .  DULoxetine (CYMBALTA) 60 MG capsule, Take 1 capsule (60 mg total) by mouth daily., Disp: 30 capsule, Rfl: 3 .  [START ON 04/12/2018] HYDROcodone-acetaminophen (NORCO) 10-325 MG tablet, Take 1 tablet by mouth daily., Disp: 30 tablet, Rfl: 0 .  meloxicam (MOBIC) 15 MG tablet, Take 1 tablet (15 mg total) by mouth daily. (Patient taking differently: Take 7.5 mg by mouth daily. ), Disp: 15 tablet, Rfl: 0 .  metFORMIN (GLUCOPHAGE) 1000 MG tablet, Take 1,000 mg by mouth 2 (two) times daily with a meal., Disp: , Rfl:  .  pregabalin (LYRICA) 75 MG capsule, Take 1 capsule (75 mg total) by mouth 3 (three) times daily., Disp: 90 capsule, Rfl: 3 .  sitaGLIPtin (JANUVIA) 100 MG tablet, Take 100 mg by mouth daily., Disp: , Rfl:  .  tiZANidine (ZANAFLEX) 2 MG tablet, Take 1 tablet (2 mg  total) by mouth 2 (two) times daily as needed for muscle spasms., Disp: 30 tablet, Rfl: 3 .  [START ON 05/12/2018] HYDROcodone-acetaminophen (NORCO) 10-325 MG tablet, Take 1 tablet by mouth daily as needed for severe pain., Disp: 30 tablet, Rfl: 0  ROS  Constitutional: Denies any fever or chills Gastrointestinal: No reported hemesis, hematochezia, vomiting, or acute GI distress Musculoskeletal: Denies any acute onset joint swelling, redness, loss of ROM, or weakness Neurological: No reported  episodes of acute onset apraxia, aphasia, dysarthria, agnosia, amnesia, paralysis, loss of coordination, or loss of consciousness  Allergies  Ms. Denson has No Known Allergies.  [place]  Drug: Ms. Fleek  reports that she does not use drugs. Alcohol:  reports that she does not drink alcohol. Tobacco:  reports that she has been smoking cigarettes. She has never used smokeless tobacco. Medical:  has a past medical history of Asthma and Diabetes mellitus without complication ([place]). Surgical: Ms. Rossy  has a past surgical history that includes Cholecystectomy. Family: family history is not on file.  Constitutional Exam  General appearance: Well nourished, well developed, and well hydrated. In no apparent acute distress Vitals:   04/04/18 0900  BP: (!) 135/59  Pulse: 69  Resp: 18  Temp: 98 F (36.7 C)  TempSrc: Oral  SpO2: 98%  Weight: 230 lb (104.3 kg)  Height: '5\' 6"'$  (1.676 m)   BMI Assessment: Estimated body mass index is 37.12 kg/m as calculated from the following:   Height as of this encounter: '5\' 6"'$  (1.676 m).   Weight as of this encounter: 230 lb (104.3 kg).  BMI interpretation table: BMI level Category Range association with higher incidence of chronic pain  <18 kg/m2 Underweight   18.5-24.9 kg/m2 Ideal body weight   25-29.9 kg/m2 Overweight Increased incidence by 20%  30-34.9 kg/m2 Obese (Class I) Increased incidence by 68%  35-39.9 kg/m2 Severe obesity (Class II) Increased  incidence by 136%  >40 kg/m2 Extreme obesity (Class III) Increased incidence by 254%   Patient's current BMI Ideal Body weight  Body mass index is 37.12 kg/m. Ideal body weight: 59.3 kg (130 lb 11.7 oz) Adjusted ideal body weight: 77.3 kg (170 lb 7 oz)   BMI Readings from Last 4 Encounters:  04/04/18 37.12 kg/m  03/20/18 38.58 kg/m  01/26/18 37.93 kg/m  10/27/16 38.74 kg/m   Wt Readings from Last 4 Encounters:  04/04/18 230 lb (104.3 kg)  03/20/18 239 lb (108.4 kg)  01/26/18 235 lb (106.6 kg)  10/27/16 240 lb (108.9 kg)  Psych/Mental status: Alert, oriented x 3 (person, place, & time)       Eyes: PERLA Respiratory: No evidence of acute respiratory distress  Cervical Spine Area Exam  Skin & Axial Inspection: No masses, redness, edema, swelling, or associated skin lesions Alignment: Symmetrical Functional ROM: Decreased ROM, to the right Stability: No instability detected Muscle Tone/Strength: Functionally intact. No obvious neuro-muscular anomalies detected. Sensory (Neurological): Dermatomal pain pattern and musculoskeletal Palpation: (+) Positive provocative maneuver for for cervical facet disease right greater than left  Upper Extremity (UE) Exam    Side: Right upper extremity  Side: Left upper extremity  Skin & Extremity Inspection: Skin color, temperature, and hair growth are WNL. No peripheral edema or cyanosis. No masses, redness, swelling, asymmetry, or associated skin lesions. No contractures.  Skin & Extremity Inspection: Skin color, temperature, and hair growth are WNL. No peripheral edema or cyanosis. No masses, redness, swelling, asymmetry, or associated skin lesions. No contractures.  Functional ROM: Unrestricted ROM          Functional ROM: Unrestricted ROM          Muscle Tone/Strength: Functionally intact. No obvious neuro-muscular anomalies detected.  Muscle Tone/Strength: Functionally intact. No obvious neuro-muscular anomalies detected.  Sensory  (Neurological): Articular pain pattern          Sensory (Neurological): Unimpaired          Palpation: No palpable anomalies  Palpation: No palpable anomalies              Provocative Test(s):  Phalen's test: deferred Tinel's test: deferred Apley's scratch test (touch opposite shoulder):  Action 1 (Across chest): deferred Action 2 (Overhead): deferred Action 3 (LB reach): deferred   Provocative Test(s):  Phalen's test: deferred Tinel's test: deferred Apley's scratch test (touch opposite shoulder):  Action 1 (Across chest): deferred Action 2 (Overhead): deferred Action 3 (LB reach): deferred    Thoracic Spine Area Exam  Skin & Axial Inspection: No masses, redness, or swelling Alignment: Symmetrical Functional ROM: Unrestricted ROM Stability: No instability detected Muscle Tone/Strength: Functionally intact. No obvious neuro-muscular anomalies detected. Sensory (Neurological): Unimpaired Muscle strength & Tone: No palpable anomalies  Lumbar Spine Area Exam  Skin & Axial Inspection: No masses, redness, or swelling Alignment: Symmetrical Functional ROM: Decreased ROM       Stability: No instability detected Muscle Tone/Strength: Functionally intact. No obvious neuro-muscular anomalies detected. Sensory (Neurological): Articular pain pattern musculoskeletal Palpation: No palpable anomalies       Provocative Tests: Hyperextension/rotation test: (+) bilaterally for facet joint pain. Lumbar quadrant test (Kemp's test): (+) bilaterally for facet joint pain. Lateral bending test: (+) due to pain. Patrick's Maneuver: (+) for right-sided S-I arthralgia             FABER test: deferred today                   S-I anterior distraction/compression test: deferred today         S-I lateral compression test: deferred today         S-I Thigh-thrust test: deferred today         S-I Gaenslen's test: deferred today          Gait & Posture Assessment  Ambulation: Unassisted Gait:  Relatively normal for age and body habitus Posture: WNL   Lower Extremity Exam    Side: Right lower extremity  Side: Left lower extremity  Stability: No instability observed          Stability: No instability observed          Skin & Extremity Inspection: Skin color, temperature, and hair growth are WNL. No peripheral edema or cyanosis. No masses, redness, swelling, asymmetry, or associated skin lesions. No contractures.  Skin & Extremity Inspection: Skin color, temperature, and hair growth are WNL. No peripheral edema or cyanosis. No masses, redness, swelling, asymmetry, or associated skin lesions. No contractures.  Functional ROM: Unrestricted ROM                  Functional ROM: Unrestricted ROM                  Muscle Tone/Strength: Functionally intact. No obvious neuro-muscular anomalies detected.  Muscle Tone/Strength: Functionally intact. No obvious neuro-muscular anomalies detected.  Sensory (Neurological): Unimpaired  Sensory (Neurological): Unimpaired  Palpation: No palpable anomalies  Palpation: No palpable anomalies   Assessment & Plan  Primary Diagnosis & Pertinent Problem List: The primary encounter diagnosis was Cervical spondylosis (R C4,5,6,7). Diagnoses of Cervicalgia, Chronic right SI joint pain, Lumbar facet arthropathy, Lumbar facet joint pain, Degenerative disc disease, cervical, Chronic pain syndrome, Cervical radiculopathy, and Controlled substance agreement signed were also pertinent to this visit.  Visit Diagnosis: 1. Cervical spondylosis (R C4,5,6,7)   2. Cervicalgia   3. Chronic right SI joint pain   4. Lumbar facet arthropathy   5. Lumbar facet joint pain   6. Degenerative disc disease, cervical  7. Chronic pain syndrome   8. Cervical radiculopathy   9. Controlled substance agreement signed   General Recommendations: The pain condition that the patient suffers from is best treated with a multidisciplinary approach that involves an increase in physical  activity to prevent de-conditioning and worsening of the pain cycle, as well as psychological counseling (formal and/or informal) to address the co-morbid psychological affects of pain. Treatment will often involve judicious use of pain medications and interventional procedures to decrease the pain, allowing the patient to participate in the physical activity that will ultimately produce long-lasting pain reductions. The goal of the multidisciplinary approach is to return the patient to a higher level of overall function and to restore their ability to perform activities of daily living.  62 year old female with a history of right cervical spondylosis most pronounced at C4, C5, C6, C7 along with right lumbar facet arthropathy most pronounced at L3, L4, L5, S1 along with mild to moderate right SI joint arthritis.  Patient follows up here for her second patient visit.  We discussed her x-ray results above.  Patient's UDS was also reviewed which was appropriate.  In regards to treatment plan, we discussed diagnostic right C4, C5, C6, C7 facet medial branch nerve blocks with possible radio frequency ablation.  Of note patient previously has had cervical epidural steroid injections which she did find helpful.  However her arm pain seems more related to carpal tunnel and the majority of her pain is in her right neck, trapezius, shoulder and periscapular region consistent with cervical facet syndrome that is further reinforced by the patient's cervical spine x-rays specifically the lateral and oblique views.  Mallie Giambra has a history of greater than 3 months of moderate to severe pain which is resulted in functional impairment.  The patient has tried various conservative therapeutic options such as NSAIDs, Tylenol, muscle relaxants, physical therapy which was inadequately effective.  Patient's pain is predominantly axial with physical exam findings suggestive of facet arthropathy. Cervical facet medial branch  nerve blocks were discussed with the patient.  Risks and benefits were reviewed.  Patient would like to proceed with right C4, C5, C6, C7 medial branch nerve block.  Regards to medication management, patient will complete pain contract with our clinic.  I will refill her hydrocodone at its previous dose as below for 2 months.  I will also refill her Cymbalta and Lyrica as below.  Plan: -Sign opioid contract -Diagnostic right C4, 5, 6, 7 facet medial branch nerve block #1 for cervical facet syndrome -Hydrocodone 10 mg daily as needed, prescription for 2 months -Refill of Cymbalta and Lyrica as below -Future considerations: Lumbar facet medial branch nerve blocks at L3, L4, L5, S1; right SI joint injection   Plan of Care  Pharmacotherapy (Medications Ordered): Meds ordered this encounter  Medications  . DULoxetine (CYMBALTA) 60 MG capsule    Sig: Take 1 capsule (60 mg total) by mouth daily.    Dispense:  30 capsule    Refill:  3  . HYDROcodone-acetaminophen (NORCO) 10-325 MG tablet    Sig: Take 1 tablet by mouth daily.    Dispense:  30 tablet    Refill:  0  . HYDROcodone-acetaminophen (NORCO) 10-325 MG tablet    Sig: Take 1 tablet by mouth daily as needed for severe pain.    Dispense:  30 tablet    Refill:  0    Do not place this medication, or any other prescription from our practice, on "Automatic Refill". Patient may  have prescription filled one day early if pharmacy is closed on scheduled refill date.  . pregabalin (LYRICA) 75 MG capsule    Sig: Take 1 capsule (75 mg total) by mouth 3 (three) times daily.    Dispense:  90 capsule    Refill:  3  . tiZANidine (ZANAFLEX) 2 MG tablet    Sig: Take 1 tablet (2 mg total) by mouth 2 (two) times daily as needed for muscle spasms.    Dispense:  30 tablet    Refill:  3   Lab-work, procedure(s), and/or referral(s): Orders Placed This Encounter  Procedures  . CERVICAL FACET (MEDIAL BRANCH NERVE BLOCK)     Pharmacological management  options:  Opioid Analgesics: We'll take over management today. See above orders Membrane stabilizer: We have discussed the possibility of optimizing this mode of therapy, if tolerated Muscle relaxant: We have discussed the possibility of a trial NSAID: We have discussed the possibility of a trial Other analgesic(s): To be determined at a later time   Considering:   Right C4, 5, 6, 7 cervical facet medial branch nerve block Lumbar facet medial branch nerve blocks at L3, L4, L5, S1;  right SI joint injection   Time Note: Greater than 50% of the 25 minute(s) of face-to-face time spent with Ms. Meadowcroft, was spent in counseling/coordination of care regarding: Ms. Lofquist's primary cause of pain, the results of her recent test(s), the significance of each one oth the test(s) anomalies and it's corresponding characteristic pain pattern(s), the treatment plan, treatment alternatives, the risks and possible complications of proposed treatment, medication side effects, going over the informed consent, the opioid analgesic risks and possible complications, the appropriate use of her medications, realistic expectations, the goals of pain management (increased in functionality), the medication agreement and the patient's responsibilities when it comes to controlled substances.  Provider-requested follow-up: Return in about 2 weeks (around 04/18/2018) for Procedure.  Future Appointments  Date Time Provider [place]  04/24/2018  9:00 AM Gillis Santa, MD [facility] None  06/05/2018  1:45 PM Gillis Santa, MD [facility] None    Primary Care Physician: Donald Prose, MD Location: [facility] Note by: Gillis Santa, M.D Date: 04/04/2018; Time: 10:54 AM  Patient Instructions  ____________________________________________________________________________________________  Preparing for Procedure with Sedation  Instructions: . Oral Intake: Do not eat or drink anything for  at least 8 hours prior to your procedure. . Transportation: Public transportation is not allowed. Bring an adult driver. The driver must be physically present in our waiting room before any procedure can be started. Marland Kitchen Physical Assistance: Bring an adult physically capable of assisting you, in the event you need help. This adult should keep you company at home for at least 6 hours after the procedure. . Blood Pressure Medicine: Take your blood pressure medicine with a sip of water the morning of the procedure. . Blood thinners: Notify our staff if you are taking any blood thinners. Depending on which one you take, there will be specific instructions on how and when to stop it. . Diabetics on insulin: Notify the staff so that you can be scheduled 1st case in the morning. If your diabetes requires high dose insulin, take only  of your normal insulin dose the morning of the procedure and notify the staff that you have done so. . Preventing infections: Shower with an antibacterial soap the morning of your procedure. . Build-up your immune system: Take 1000 mg of Vitamin C with every meal (3 times a day)  the day prior to your procedure. Marland Kitchen Antibiotics: Inform the staff if you have a condition or reason that requires you to take antibiotics before dental procedures. . Pregnancy: If you are pregnant, call and cancel the procedure. . Sickness: If you have a cold, fever, or any active infections, call and cancel the procedure. . Arrival: You must be in the facility at least 30 minutes prior to your scheduled procedure. . Children: Do not bring children with you. . Dress appropriately: Bring dark clothing that you would not mind if they get stained. . Valuables: Do not bring any jewelry or valuables.  Procedure appointments are reserved for interventional treatments only. Marland Kitchen No Prescription Refills. . No medication changes will be discussed during procedure appointments. . No disability issues will be  discussed.  Reasons to call and reschedule or cancel your procedure: (Following these recommendations will minimize the risk of a serious complication.) . Surgeries: Avoid having procedures within 2 weeks of any surgery. (Avoid for 2 weeks before or after any surgery). . Flu Shots: Avoid having procedures within 2 weeks of a flu shots or . (Avoid for 2 weeks before or after immunizations). . Barium: Avoid having a procedure within 7-10 days after having had a radiological study involving the use of radiological contrast. (Myelograms, Barium swallow or enema study). . Heart attacks: Avoid any elective procedures or surgeries for the initial 6 months after a "Myocardial Infarction" (Heart Attack). . Blood thinners: It is imperative that you stop these medications before procedures. Let us know if you if you take any blood thinner.  . Infection: Avoid procedures during or within two weeks of an infection (including chest colds or gastrointestinal problems). Symptoms associated with infections include: Localized redness, fever, chills, night sweats or profuse sweating, burning sensation when voiding, cough, congestion, stuffiness, runny nose, sore throat, diarrhea, nausea, vomiting, cold or Flu symptoms, recent or current infections. It is specially important if the infection is over the area that we intend to treat. Marland Kitchen Heart and lung problems: Symptoms that may suggest an active cardiopulmonary problem include: cough, chest pain, breathing difficulties or shortness of breath, dizziness, ankle swelling, uncontrolled high or unusually low blood pressure, and/or palpitations. If you are experiencing any of these symptoms, cancel your procedure and contact your primary care physician for an evaluation.  Remember:  Regular Business hours are:  Monday to Thursday 8:00 AM to 4:00 PM  Provider's Schedule: Milinda Pointer, MD:  Procedure days: Tuesday and Thursday 7:30 AM to 4:00 PM  Gillis Santa, MD:   Procedure days: Monday and Wednesday 7:30 AM to 4:00 PM ____________________________________________________________________________________________ Prescriptions for Hydrocodone, Tizanidine, Cymbalta, and Lyrica were sent to your pharmacy.

## 2018-04-24 ENCOUNTER — Encounter: Payer: Self-pay | Admitting: Student in an Organized Health Care Education/Training Program

## 2018-04-24 ENCOUNTER — Ambulatory Visit (HOSPITAL_BASED_OUTPATIENT_CLINIC_OR_DEPARTMENT_OTHER): Payer: BLUE CROSS/BLUE SHIELD | Admitting: Student in an Organized Health Care Education/Training Program

## 2018-04-24 ENCOUNTER — Ambulatory Visit
Admission: RE | Admit: 2018-04-24 | Discharge: 2018-04-24 | Disposition: A | Discharge status: Home / Self Care | Payer: BLUE CROSS/BLUE SHIELD | Source: Ambulatory Visit | Attending: Student in an Organized Health Care Education/Training Program | Admitting: Student in an Organized Health Care Education/Training Program

## 2018-04-24 VITALS — BP 138/78 | HR 66 | Temp 98.3°F | Resp 17 | Ht 66.0 in | Wt 230.0 lb

## 2018-04-24 DIAGNOSIS — M47812 Spondylosis without myelopathy or radiculopathy, cervical region: Secondary | ICD-10-CM | POA: Insufficient documentation

## 2018-04-24 MED ORDER — LIDOCAINE HCL 2 % IJ SOLN
INTRAMUSCULAR | Status: AC
  Filled 2018-04-24: qty 20

## 2018-04-24 MED ORDER — DEXAMETHASONE SODIUM PHOSPHATE 10 MG/ML IJ SOLN
10.0000 mg | Freq: Once | INTRAMUSCULAR | Status: AC
  Administered 2018-04-24: 10 mg

## 2018-04-24 MED ORDER — DEXAMETHASONE SODIUM PHOSPHATE 10 MG/ML IJ SOLN
10.0000 mg | Freq: Once | INTRAMUSCULAR | Status: AC
  Administered 2018-04-24: 10 mg
  Filled 2018-04-24: qty 1

## 2018-04-24 MED ORDER — LIDOCAINE HCL 2 % IJ SOLN
20.0000 mL | Freq: Once | INTRAMUSCULAR | Status: AC
  Administered 2018-04-24: 400 mg

## 2018-04-24 MED ORDER — FENTANYL CITRATE (PF) 100 MCG/2ML IJ SOLN
INTRAMUSCULAR | Status: AC
  Filled 2018-04-24: qty 2

## 2018-04-24 MED ORDER — ROPIVACAINE HCL 2 MG/ML IJ SOLN
INTRAMUSCULAR | Status: AC
  Filled 2018-04-24: qty 10

## 2018-04-24 MED ORDER — DEXAMETHASONE SODIUM PHOSPHATE 10 MG/ML IJ SOLN
INTRAMUSCULAR | Status: AC
  Filled 2018-04-24: qty 1

## 2018-04-24 MED ORDER — ROPIVACAINE HCL 2 MG/ML IJ SOLN
10.0000 mL | Freq: Once | INTRAMUSCULAR | Status: AC
  Administered 2018-04-24: 10 mL

## 2018-04-24 NOTE — Progress Notes (Signed)
Safety precautions to be maintained throughout the outpatient stay will include: orient to surroundings, keep bed in low position, maintain call bell within reach at all times, provide assistance with transfer out of bed and ambulation.  

## 2018-04-24 NOTE — Progress Notes (Signed)
Patient's Name: Colleen Andrade  MRN: [id number]  Referring Provider: Deatra James, MD  DOB: [birth year]  PCP: Deatra James, MD  DOS: 04/24/2018  Note by: Edward Jolly, MD  Service setting: Ambulatory outpatient  Specialty: Interventional Pain Management  Patient type: Established  Location: [facility] (AMB) Pain Management Facility  Visit type: Interventional Procedure   Primary Reason for Visit: Interventional Pain Management Treatment. CC: Neck Pain (right)  Procedure:          Anesthesia, Analgesia, Anxiolysis:  Type: Cervical Facet Medial Branch Block(s)           Primary Purpose: Diagnostic Region: Posterolateral cervical spine Level:C4, C5, C6, & C7 Medial Branch Level(s). Injecting these levels blocks the C4-5, C5-6, and C6-7 cervical facet joints. Laterality: Right  Type: Local Anesthesia Indication(s): Analgesia         Route: Infiltration ([place]/IM) IV Access: Declined Sedation: Declined  Local Anesthetic: Lidocaine 1-2%  Position: Prone with head of the table raised to facilitate breathing.   Indications: 1. Cervical spondylosis (R C4,5,6,7)    Pain Score: Pre-procedure: 7 /10 Post-procedure: 0-No pain/10  Pre-op Assessment:  Colleen Andrade is a 62 y.o. (year old), female patient, seen today for interventional treatment. She  has a past surgical history that includes Cholecystectomy. Colleen Andrade has a current medication list which includes the following prescription(s): duloxetine, hydrocodone-acetaminophen, hydrocodone-acetaminophen, meloxicam, metformin, pregabalin, sitagliptin, and tizanidine. Her primarily concern today is the Neck Pain (right)  Initial Vital Signs:  Pulse/HCG Rate: 71  Temp: 98.3 F (36.8 C) Resp: 16 BP: (!) 141/73 SpO2: 98 %  BMI: Estimated body mass index is 37.12 kg/m as calculated from the following:   Height as of this encounter: 5\' 6"  (1.676 m).   Weight as of this encounter: 230 lb (104.3 kg).  Risk Assessment: Allergies: Reviewed. She has  No Known Allergies.  Allergy Precautions: None required Coagulopathies: Reviewed. None identified.  Blood-thinner therapy: None at this time Active Infection(s): Reviewed. None identified. Colleen Andrade is afebrile  Site Confirmation: Colleen Andrade was asked to confirm the procedure and laterality before marking the site Procedure checklist: Completed Consent: Before the procedure and under the influence of no sedative(s), amnesic(s), or anxiolytics, the patient was informed of the treatment options, risks and possible complications. To fulfill our ethical and legal obligations, as recommended by the American Medical Association's Code of Ethics, I have informed the patient of my clinical impression; the nature and purpose of the treatment or procedure; the risks, benefits, and possible complications of the intervention; the alternatives, including doing nothing; the risk(s) and benefit(s) of the alternative treatment(s) or procedure(s); and the risk(s) and benefit(s) of doing nothing. The patient was provided information about the general risks and possible complications associated with the procedure. These may include, but are not limited to: failure to achieve desired goals, infection, bleeding, organ or nerve damage, allergic reactions, paralysis, and death. In addition, the patient was informed of those risks and complications associated to Spine-related procedures, such as failure to decrease pain; infection (i.e.: Meningitis, epidural or intraspinal abscess); bleeding (i.e.: epidural hematoma, subarachnoid hemorrhage, or any other type of intraspinal or peri-dural bleeding); organ or nerve damage (i.e.: Any type of peripheral nerve, nerve root, or spinal cord injury) with subsequent damage to sensory, motor, and/or autonomic systems, resulting in permanent pain, numbness, and/or weakness of one or several areas of the body; allergic reactions; (i.e.: anaphylactic reaction); and/or  death. Furthermore, the patient was informed of those risks and complications associated with the medications. These  include, but are not limited to: allergic reactions (i.e.: anaphylactic or anaphylactoid reaction(s)); adrenal axis suppression; blood sugar elevation that in diabetics may result in ketoacidosis or comma; water retention that in patients with history of congestive heart failure may result in shortness of breath, pulmonary edema, and decompensation with resultant heart failure; weight gain; swelling or edema; medication-induced neural toxicity; particulate matter embolism and blood vessel occlusion with resultant organ, and/or nervous system infarction; and/or aseptic necrosis of one or more joints. Finally, the patient was informed that Medicine is not an exact science; therefore, there is also the possibility of unforeseen or unpredictable risks and/or possible complications that may result in a catastrophic outcome. The patient indicated having understood very clearly. We have given the patient no guarantees and we have made no promises. Enough time was given to the patient to ask questions, all of which were answered to the patient's satisfaction. Colleen Andrade has indicated that she wanted to continue with the procedure. Attestation: I, the ordering provider, attest that I have discussed with the patient the benefits, risks, side-effects, alternatives, likelihood of achieving goals, and potential problems during recovery for the procedure that I have provided informed consent. Date  Time: 04/24/2018  9:04 AM  Pre-Procedure Preparation:  Monitoring: As per clinic protocol. Respiration, ETCO2, SpO2, BP, heart rate and rhythm monitor placed and checked for adequate function Safety Precautions: Patient was assessed for positional comfort and pressure points before starting the procedure. Time-out: I initiated and conducted the "Time-out" before starting the procedure, as per protocol. The  patient was asked to participate by confirming the accuracy of the "Time Out" information. Verification of the correct person, site, and procedure were performed and confirmed by me, the nursing staff, and the patient. "Time-out" conducted as per Joint Commission's Universal Protocol (UP.01.01.01). Time: 0938  Description of Procedure:          Laterality: Right Level: C4, C5, C6, & C7 Medial Branch Level(s). Area Prepped: Posterior Cervico-thoracic Region Prepping solution: ChloraPrep (2% chlorhexidine gluconate and 70% isopropyl alcohol) Safety Precautions: Aspiration looking for blood return was conducted prior to all injections. At no point did we inject any substances, as a needle was being advanced. Before injecting, the patient was told to immediately notify me if she was experiencing any new onset of "ringing in the ears, or metallic taste in the mouth". No attempts were made at seeking any paresthesias. Safe injection practices and needle disposal techniques used. Medications properly checked for expiration dates. SDV (single dose vial) medications used. After the completion of the procedure, all disposable equipment used was discarded in the proper designated medical waste containers. Local Anesthesia: Protocol guidelines were followed. The patient was positioned over the fluoroscopy table. The area was prepped in the usual manner. The time-out was completed. The target area was identified using fluoroscopy. A 12-in long, straight, sterile hemostat was used with fluoroscopic guidance to locate the targets for each level blocked. Once located, the skin was marked with an approved surgical skin marker. Once all sites were marked, the skin (epidermis, dermis, and hypodermis), as well as deeper tissues (fat, connective tissue and muscle) were infiltrated with a small amount of a short-acting local anesthetic, loaded on a 10cc syringe with a 25G, 1.5-in  Needle. An appropriate amount of time was  allowed for local anesthetics to take effect before proceeding to the next step. Local Anesthetic: Lidocaine 2.0% The unused portion of the local anesthetic was discarded in the proper designated containers. Technical explanation of process:  C4 Medial Branch Nerve Block (MBB): The target area for the C4 dorsal medial articular branch is the lateral concave waist of the articular pillar of C4. Under fluoroscopic guidance, a Quincke needle was inserted until contact was made with os over the postero-lateral aspect of the articular pillar of C4 (target area). After negative aspiration for blood, 1 mL of the nerve block solution was injected without difficulty or complication. The needle was removed intact. C5 Medial Branch Nerve Block (MBB): The target area for the C5 dorsal medial articular branch is the lateral concave waist of the articular pillar of C5. Under fluoroscopic guidance, a Quincke needle was inserted until contact was made with os over the postero-lateral aspect of the articular pillar of C5 (target area). After negative aspiration for blood, 1mL of the nerve block solution was injected without difficulty or complication. The needle was removed intact. C6 Medial Branch Nerve Block (MBB): The target area for the C6 dorsal medial articular branch is the lateral concave waist of the articular pillar of C6. Under fluoroscopic guidance, a Quincke needle was inserted until contact was made with os over the postero-lateral aspect of the articular pillar of C6 (target area). After negative aspiration for blood, 1 mL of the nerve block solution was injected without difficulty or complication. The needle was removed intact. C7 Medial Branch Nerve Block (MBB): The target for the C7 dorsal medial articular branch lies on the superior-medial tip of the C7 transverse process. Under fluoroscopic guidance, a Quincke needle was inserted until contact was made with os over the postero-lateral aspect of the  articular pillar of C7 (target area). After negative aspiration for blood, 1mL of the nerve block solution was injected without difficulty or complication. The needle was removed intact. Procedural Needles: 22-gauge, 3.5-inch, Quincke needles used for all levels. Nerve block solution: 5 cc solution made of 4 cc of 0.2% ropivacaine, 1 cc of Decadron 10 mg/cc. 1 cc injected at each level above on the right.  The unused portion of the solution was discarded in the proper designated containers.  Once the entire procedure was completed, the treated area was cleaned, making sure to leave some of the prepping solution back to take advantage of its long term bactericidal properties.  Vitals:   04/24/18 0930 04/24/18 0940 04/24/18 0945 04/24/18 0955  BP: 123/82 132/81 (!) 148/80 138/78  Pulse: 71 68 67 66  Resp: 14 13 14 17   Temp:      TempSrc:      SpO2: 98% 99% 97% 98%  Weight:      Height:        Start Time: 0948 hrs. End Time: 0946 hrs.  Imaging Guidance (Spinal):          Type of Imaging Technique: Fluoroscopy Guidance (Spinal) Indication(s): Assistance in needle guidance and placement for procedures requiring needle placement in or near specific anatomical locations not easily accessible without such assistance. Exposure Time: Please see nurses notes. Contrast: None used. Fluoroscopic Guidance: I was personally present during the use of fluoroscopy. "Tunnel Vision Technique" used to obtain the best possible view of the target area. Parallax error corrected before commencing the procedure. "Direction-depth-direction" technique used to introduce the needle under continuous pulsed fluoroscopy. Once target was reached, antero-posterior, oblique, and lateral fluoroscopic projection used confirm needle placement in all planes. Images permanently stored in EMR. Interpretation: No contrast injected. I personally interpreted the imaging intraoperatively. Adequate needle placement confirmed in multiple  planes. Permanent images saved into the patient's record.  Antibiotic Prophylaxis:   Anti-infectives (From admission, onward)   None     Indication(s): None identified  Post-operative Assessment:  Post-procedure Vital Signs:  Pulse/HCG Rate: 66  Temp: 98.3 F (36.8 C) Resp: 17 BP: 138/78 SpO2: 98 %  EBL: None  Complications: No immediate post-treatment complications observed by team, or reported by patient.  Note: The patient tolerated the entire procedure well. A repeat set of vitals were taken after the procedure and the patient was kept under observation following institutional policy, for this type of procedure. Post-procedural neurological assessment was performed, showing return to baseline, prior to discharge. The patient was provided with post-procedure discharge instructions, including a section on how to identify potential problems. Should any problems arise concerning this procedure, the patient was given instructions to immediately contact us, at any time, without hesitation. In any case, we plan to contact the patient by telephone for a follow-up status report regarding this interventional procedure.  Comments:  No additional relevant information. 5 out of 5 strength bilateral upper extremity: Shoulder abduction, elbow flexion, elbow extension, thumb extension.  Plan of Care    Imaging Orders     DG C-Arm 1-60 Min-No Report Procedure Orders    No procedure(s) ordered today    Medications ordered for procedure: Meds ordered this encounter  Medications  . ropivacaine (PF) 2 mg/mL (0.2%) (NAROPIN) injection 10 mL  . lidocaine (XYLOCAINE) 2 % (with pres) injection 400 mg  . dexamethasone (DECADRON) injection 10 mg  . dexamethasone (DECADRON) injection 10 mg   Medications administered: We administered ropivacaine (PF) 2 mg/mL (0.2%), lidocaine, dexamethasone, and dexamethasone.  See the medical record for exact dosing, route, and time of  administration.  Disposition: Discharge home  Discharge Date & Time: 04/24/2018; 0958 hrs.   Physician-requested Follow-up: Return in about 4 weeks (around 05/22/2018) for Post Procedure Evaluation.  Future Appointments  Date Time Provider Department Center  05/22/2018  2:00 PM Edward Jolly, MD [facility] None  06/05/2018  1:45 PM Edward Jolly, MD [facility] None   Primary Care Physician: Deatra James, MD Location: [facility] Facility Note by: Edward Jolly, MD Date: 04/24/2018; Time: 10:34 AM  Disclaimer:  Medicine is not an exact science. The only guarantee in medicine is that nothing is guaranteed. It is important to note that the decision to proceed with this intervention was based on the information collected from the patient. The Data and conclusions were drawn from the patient's questionnaire, the interview, and the physical examination. Because the information was provided in large part by the patient, it cannot be guaranteed that it has not been purposely or unconsciously manipulated. Every effort has been made to obtain as much relevant data as possible for this evaluation. It is important to note that the conclusions that lead to this procedure are derived in large part from the available data. Always take into account that the treatment will also be dependent on availability of resources and existing treatment guidelines, considered by other Pain Management Practitioners as being common knowledge and practice, at the time of the intervention. For Medico-Legal purposes, it is also important to point out that variation in procedural techniques and pharmacological choices are the acceptable norm. The indications, contraindications, technique, and results of the above procedure should only be interpreted and judged by a Board-Certified Interventional Pain Specialist with extensive familiarity and expertise in the same exact procedure and technique.

## 2018-04-24 NOTE — Patient Instructions (Signed)

## 2018-04-25 ENCOUNTER — Telehealth: Payer: Self-pay | Admitting: *Deleted

## 2018-04-25 NOTE — Telephone Encounter (Signed)
Attempted to call for post procedure follow-up. Message left. 

## 2018-05-22 ENCOUNTER — Encounter: Payer: Self-pay | Admitting: Student in an Organized Health Care Education/Training Program

## 2018-05-22 ENCOUNTER — Ambulatory Visit
Payer: BLUE CROSS/BLUE SHIELD | Attending: Student in an Organized Health Care Education/Training Program | Admitting: Student in an Organized Health Care Education/Training Program

## 2018-05-22 VITALS — BP 131/69 | HR 65 | Temp 98.0°F | Resp 16 | Ht 66.0 in | Wt 230.0 lb

## 2018-05-22 DIAGNOSIS — G8929 Other chronic pain: Secondary | ICD-10-CM

## 2018-05-22 DIAGNOSIS — M4722 Other spondylosis with radiculopathy, cervical region: Secondary | ICD-10-CM | POA: Insufficient documentation

## 2018-05-22 DIAGNOSIS — Z7984 Long term (current) use of oral hypoglycemic drugs: Secondary | ICD-10-CM | POA: Insufficient documentation

## 2018-05-22 DIAGNOSIS — Z79899 Other long term (current) drug therapy: Secondary | ICD-10-CM | POA: Diagnosis not present

## 2018-05-22 DIAGNOSIS — M5412 Radiculopathy, cervical region: Secondary | ICD-10-CM

## 2018-05-22 DIAGNOSIS — M5441 Lumbago with sciatica, right side: Secondary | ICD-10-CM | POA: Diagnosis not present

## 2018-05-22 DIAGNOSIS — E119 Type 2 diabetes mellitus without complications: Secondary | ICD-10-CM | POA: Diagnosis not present

## 2018-05-22 DIAGNOSIS — M533 Sacrococcygeal disorders, not elsewhere classified: Secondary | ICD-10-CM | POA: Diagnosis not present

## 2018-05-22 DIAGNOSIS — M542 Cervicalgia: Secondary | ICD-10-CM

## 2018-05-22 DIAGNOSIS — F17210 Nicotine dependence, cigarettes, uncomplicated: Secondary | ICD-10-CM | POA: Insufficient documentation

## 2018-05-22 DIAGNOSIS — M47812 Spondylosis without myelopathy or radiculopathy, cervical region: Secondary | ICD-10-CM

## 2018-05-22 DIAGNOSIS — G894 Chronic pain syndrome: Secondary | ICD-10-CM | POA: Insufficient documentation

## 2018-05-22 DIAGNOSIS — Z791 Long term (current) use of non-steroidal anti-inflammatories (NSAID): Secondary | ICD-10-CM | POA: Diagnosis not present

## 2018-05-22 DIAGNOSIS — J45909 Unspecified asthma, uncomplicated: Secondary | ICD-10-CM | POA: Insufficient documentation

## 2018-05-22 NOTE — Progress Notes (Signed)
Safety precautions to be maintained throughout the outpatient stay will include: orient to surroundings, keep bed in low position, maintain call bell within reach at all times, provide assistance with transfer out of bed and ambulation.  

## 2018-05-22 NOTE — Patient Instructions (Signed)
____________________________________________________________________________________________  Preparing for your procedure (without sedation)  Instructions: . Oral Intake: Do not eat or drink anything for at least 3 hours prior to your procedure. . Transportation: Unless otherwise stated by your physician, you may drive yourself after the procedure. . Blood Pressure Medicine: Take your blood pressure medicine with a sip of water the morning of the procedure. . Blood thinners: Notify our staff if you are taking any blood thinners. Depending on which one you take, there will be specific instructions on how and when to stop it. . Diabetics on insulin: Notify the staff so that you can be scheduled 1st case in the morning. If your diabetes requires high dose insulin, take only  of your normal insulin dose the morning of the procedure and notify the staff that you have done so. . Preventing infections: Shower with an antibacterial soap the morning of your procedure.  . Build-up your immune system: Take 1000 mg of Vitamin C with every meal (3 times a day) the day prior to your procedure. Marland Kitchen Antibiotics: Inform the staff if you have a condition or reason that requires you to take antibiotics before dental procedures. . Pregnancy: If you are pregnant, call and cancel the procedure. . Sickness: If you have a cold, fever, or any active infections, call and cancel the procedure. . Arrival: You must be in the facility at least 30 minutes prior to your scheduled procedure. . Children: Do not bring any children with you. . Dress appropriately: Bring dark clothing that you would not mind if they get stained. . Valuables: Do not bring any jewelry or valuables.  Procedure appointments are reserved for interventional treatments only. Marland Kitchen No Prescription Refills. . No medication changes will be discussed during procedure appointments. . No disability issues will be discussed.  Reasons to call and reschedule or  cancel your procedure: (Following these recommendations will minimize the risk of a serious complication.) . Surgeries: Avoid having procedures within 2 weeks of any surgery. (Avoid for 2 weeks before or after any surgery). . Flu Shots: Avoid having procedures within 2 weeks of a flu shots or . (Avoid for 2 weeks before or after immunizations). . Barium: Avoid having a procedure within 7-10 days after having had a radiological study involving the use of radiological contrast. (Myelograms, Barium swallow or enema study). . Heart attacks: Avoid any elective procedures or surgeries for the initial 6 months after a "Myocardial Infarction" (Heart Attack). . Blood thinners: It is imperative that you stop these medications before procedures. Let us know if you if you take any blood thinner.  . Infection: Avoid procedures during or within two weeks of an infection (including chest colds or gastrointestinal problems). Symptoms associated with infections include: Localized redness, fever, chills, night sweats or profuse sweating, burning sensation when voiding, cough, congestion, stuffiness, runny nose, sore throat, diarrhea, nausea, vomiting, cold or Flu symptoms, recent or current infections. It is specially important if the infection is over the area that we intend to treat. Marland Kitchen Heart and lung problems: Symptoms that may suggest an active cardiopulmonary problem include: cough, chest pain, breathing difficulties or shortness of breath, dizziness, ankle swelling, uncontrolled high or unusually low blood pressure, and/or palpitations. If you are experiencing any of these symptoms, cancel your procedure and contact your primary care physician for an evaluation.  Remember:  Regular Business hours are:  Monday to Thursday 8:00 AM to 4:00 PM  Provider's Schedule: Milinda Pointer, MD:  Procedure days: Tuesday and Thursday 7:30 AM to  4:00 PM  Edward Jolly, MD:  Procedure days: Monday and Wednesday 7:30 AM to 4:00  PM ____________________________________________________________________________________________  ____________________________________________________________________________________________  Preparing for your procedure (without sedation)  Instructions: . Oral Intake: Do not eat or drink anything for at least 3 hours prior to your procedure. . Transportation: Unless otherwise stated by your physician, you may drive yourself after the procedure. . Blood Pressure Medicine: Take your blood pressure medicine with a sip of water the morning of the procedure. . Blood thinners: Notify our staff if you are taking any blood thinners. Depending on which one you take, there will be specific instructions on how and when to stop it. . Diabetics on insulin: Notify the staff so that you can be scheduled 1st case in the morning. If your diabetes requires high dose insulin, take only  of your normal insulin dose the morning of the procedure and notify the staff that you have done so. . Preventing infections: Shower with an antibacterial soap the morning of your procedure.  . Build-up your immune system: Take 1000 mg of Vitamin C with every meal (3 times a day) the day prior to your procedure. Marland Kitchen Antibiotics: Inform the staff if you have a condition or reason that requires you to take antibiotics before dental procedures. . Pregnancy: If you are pregnant, call and cancel the procedure. . Sickness: If you have a cold, fever, or any active infections, call and cancel the procedure. . Arrival: You must be in the facility at least 30 minutes prior to your scheduled procedure. . Children: Do not bring any children with you. . Dress appropriately: Bring dark clothing that you would not mind if they get stained. . Valuables: Do not bring any jewelry or valuables.  Procedure appointments are reserved for interventional treatments only. Marland Kitchen No Prescription Refills. . No medication changes will be discussed during  procedure appointments. . No disability issues will be discussed.  Reasons to call and reschedule or cancel your procedure: (Following these recommendations will minimize the risk of a serious complication.) . Surgeries: Avoid having procedures within 2 weeks of any surgery. (Avoid for 2 weeks before or after any surgery). . Flu Shots: Avoid having procedures within 2 weeks of a flu shots or . (Avoid for 2 weeks before or after immunizations). . Barium: Avoid having a procedure within 7-10 days after having had a radiological study involving the use of radiological contrast. (Myelograms, Barium swallow or enema study). . Heart attacks: Avoid any elective procedures or surgeries for the initial 6 months after a "Myocardial Infarction" (Heart Attack). . Blood thinners: It is imperative that you stop these medications before procedures. Let us know if you if you take any blood thinner.  . Infection: Avoid procedures during or within two weeks of an infection (including chest colds or gastrointestinal problems). Symptoms associated with infections include: Localized redness, fever, chills, night sweats or profuse sweating, burning sensation when voiding, cough, congestion, stuffiness, runny nose, sore throat, diarrhea, nausea, vomiting, cold or Flu symptoms, recent or current infections. It is specially important if the infection is over the area that we intend to treat. Marland Kitchen Heart and lung problems: Symptoms that may suggest an active cardiopulmonary problem include: cough, chest pain, breathing difficulties or shortness of breath, dizziness, ankle swelling, uncontrolled high or unusually low blood pressure, and/or palpitations. If you are experiencing any of these symptoms, cancel your procedure and contact your primary care physician for an evaluation.  Remember:  Regular Business hours are:  Monday to Thursday  8:00 AM to 4:00 PM  Provider's Schedule: Delano Metz, MD:  Procedure days: Tuesday and  Thursday 7:30 AM to 4:00 PM  Edward Jolly, MD:  Procedure days: Monday and Wednesday 7:30 AM to 4:00 PM ____________________________________________________________________________________________  ____________________________________________________________________________________________  Preparing for your procedure (without sedation)  Instructions: . Oral Intake: Do not eat or drink anything for at least 3 hours prior to your procedure. . Transportation: Unless otherwise stated by your physician, you may drive yourself after the procedure. . Blood Pressure Medicine: Take your blood pressure medicine with a sip of water the morning of the procedure. . Blood thinners: Notify our staff if you are taking any blood thinners. Depending on which one you take, there will be specific instructions on how and when to stop it. . Diabetics on insulin: Notify the staff so that you can be scheduled 1st case in the morning. If your diabetes requires high dose insulin, take only  of your normal insulin dose the morning of the procedure and notify the staff that you have done so. . Preventing infections: Shower with an antibacterial soap the morning of your procedure.  . Build-up your immune system: Take 1000 mg of Vitamin C with every meal (3 times a day) the day prior to your procedure. Marland Kitchen Antibiotics: Inform the staff if you have a condition or reason that requires you to take antibiotics before dental procedures. . Pregnancy: If you are pregnant, call and cancel the procedure. . Sickness: If you have a cold, fever, or any active infections, call and cancel the procedure. . Arrival: You must be in the facility at least 30 minutes prior to your scheduled procedure. . Children: Do not bring any children with you. . Dress appropriately: Bring dark clothing that you would not mind if they get stained. . Valuables: Do not bring any jewelry or valuables.  Procedure appointments are reserved for  interventional treatments only. Marland Kitchen No Prescription Refills. . No medication changes will be discussed during procedure appointments. . No disability issues will be discussed.  Reasons to call and reschedule or cancel your procedure: (Following these recommendations will minimize the risk of a serious complication.) . Surgeries: Avoid having procedures within 2 weeks of any surgery. (Avoid for 2 weeks before or after any surgery). . Flu Shots: Avoid having procedures within 2 weeks of a flu shots or . (Avoid for 2 weeks before or after immunizations). . Barium: Avoid having a procedure within 7-10 days after having had a radiological study involving the use of radiological contrast. (Myelograms, Barium swallow or enema study). . Heart attacks: Avoid any elective procedures or surgeries for the initial 6 months after a "Myocardial Infarction" (Heart Attack). . Blood thinners: It is imperative that you stop these medications before procedures. Let us know if you if you take any blood thinner.  . Infection: Avoid procedures during or within two weeks of an infection (including chest colds or gastrointestinal problems). Symptoms associated with infections include: Localized redness, fever, chills, night sweats or profuse sweating, burning sensation when voiding, cough, congestion, stuffiness, runny nose, sore throat, diarrhea, nausea, vomiting, cold or Flu symptoms, recent or current infections. It is specially important if the infection is over the area that we intend to treat. Marland Kitchen Heart and lung problems: Symptoms that may suggest an active cardiopulmonary problem include: cough, chest pain, breathing difficulties or shortness of breath, dizziness, ankle swelling, uncontrolled high or unusually low blood pressure, and/or palpitations. If you are experiencing any of these symptoms, cancel your procedure  and contact your primary care physician for an evaluation.  Remember:  Regular Business hours are:   Monday to Thursday 8:00 AM to 4:00 PM  Provider's Schedule: Delano MetzFrancisco Naveira, MD:  Procedure days: Tuesday and Thursday 7:30 AM to 4:00 PM  Edward JollyBilal Lateef, MD:  Procedure days: Monday and Wednesday 7:30 AM to 4:00 PM ____________________________________________________________________________________________  Facet Blocks Patient Information  Description: The facets are joints in the spine between the vertebrae.  Like any joints in the body, facets can become irritated and painful.  Arthritis can also effect the facets.  By injecting steroids and local anesthetic in and around these joints, we can temporarily block the nerve supply to them.  Steroids act directly on irritated nerves and tissues to reduce selling and inflammation which often leads to decreased pain.  Facet blocks may be done anywhere along the spine from the neck to the low back depending upon the location of your pain.   After numbing the skin with local anesthetic (like Novocaine), a small needle is passed onto the facet joints under x-ray guidance.  You may experience a sensation of pressure while this is being done.  The entire block usually lasts about 15-25 minutes.   Conditions which may be treated by facet blocks:   Low back/buttock pain  Neck/shoulder pain  Certain types of headaches  Preparation for the injection:  1. Do not eat any solid food or dairy products within 8 hours of your appointment. 2. You may drink clear liquid up to 3 hours before appointment.  Clear liquids include water, black coffee, juice or soda.  No milk or cream please. 3. You may take your regular medication, including pain medications, with a sip of water before your appointment.  Diabetics should hold regular insulin (if taken separately) and take 1/2 normal NPH dose the morning of the procedure.  Carry some sugar containing items with you to your appointment. 4. A driver must accompany you and be prepared to drive you home after your  procedure. 5. Bring all your current medications with you. 6. An IV may be inserted and sedation may be given at the discretion of the physician. 7. A blood pressure cuff, EKG and other monitors will often be applied during the procedure.  Some patients may need to have extra oxygen administered for a short period. 8. You will be asked to provide medical information, including your allergies and medications, prior to the procedure.  We must know immediately if you are taking blood thinners (like Coumadin/Warfarin) or if you are allergic to IV iodine contrast (dye).  We must know if you could possible be pregnant.  Possible side-effects:   Bleeding from needle site  Infection (rare, may require surgery)  Nerve injury (rare)  Numbness & tingling (temporary)  Difficulty urinating (rare, temporary)  Spinal headache (a headache worse with upright posture)  Light-headedness (temporary)  Pain at injection site (serveral days)  Decreased blood pressure (rare, temporary)  Weakness in arm/leg (temporary)  Pressure sensation in back/neck (temporary)   Call if you experience:   Fever/chills associated with headache or increased back/neck pain  Headache worsened by an upright position  New onset, weakness or numbness of an extremity below the injection site  Hives or difficulty breathing (go to the emergency room)  Inflammation or drainage at the injection site(s)  Severe back/neck pain greater than usual  New symptoms which are concerning to you  Please note:  Although the local anesthetic injected can often make your back or  neck feel good for several hours after the injection, the pain will likely return. It takes 3-7 days for steroids to work.  You may not notice any pain relief for at least one week.  If effective, we will often do a series of 2-3 injections spaced 3-6 weeks apart to maximally decrease your pain.  After the initial series, you may be a candidate for a  more permanent nerve block of the facets.  If you have any questions, please call #[phone] [facility]

## 2018-05-22 NOTE — Progress Notes (Signed)
Patient's Name: Colleen Andrade  MRN: [id number]  Referring Provider: Donald Prose, MD  DOB: 1955/07/07  PCP: Donald Prose, MD  DOS: 05/22/2018  Note by: Gillis Santa, MD  Service setting: Ambulatory outpatient  Specialty: Interventional Pain Management  Location: [facility] (AMB) Pain Management Facility    Patient type: Established   Primary Reason(s) for Visit: Encounter for post-procedure evaluation of chronic illness with mild to moderate exacerbation CC: Neck Pain (right )  HPI  Colleen Andrade is a 62 y.o. year old, female patient, who comes today for a post-procedure evaluation. She has Cervical radiculopathy; Cervicalgia; Degenerative disc disease, cervical; Chronic right-sided low back pain with right-sided sciatica; Chronic right SI joint pain; and Chronic pain syndrome on their problem list. Her primarily concern today is the Neck Pain (right )  Pain Assessment: Location: Right Neck Radiating: into right shoulder front and back.  Onset: More than a month ago Duration: Chronic pain Quality: Nagging, Discomfort, Constant, Sore Severity: 4 /10 (subjective, self-reported pain score)  Note: Reported level is compatible with observation.                         When using our objective Pain Scale, levels between 6 and 10/10 are said to belong in an emergency room, as it progressively worsens from a 6/10, described as severely limiting, requiring emergency care not usually available at an outpatient pain management facility. At a 6/10 level, communication becomes difficult and requires great effort. Assistance to reach the emergency department may be required. Facial flushing and profuse sweating along with potentially dangerous increases in heart rate and blood pressure will be evident. Effect on ADL: patient states that it is not affecting day to day too much  Timing: Constant Modifying factors: procedure helped x 1 week, rest, positioning, medications BP: 131/69  HR: 65  Colleen Andrade comes in  today for post-procedure evaluation.  Further details on both, my assessment(s), as well as the proposed treatment plan, please see below.  Post-Procedure Assessment  04/24/2018 Procedure: Right C4, C5, C6, C7 cervical facet medial branch nerve block Pre-procedure pain score:  7/10 Post-procedure pain score: 0/10         Influential Factors: BMI: 37.12 kg/m Intra-procedural challenges: None observed.         Assessment challenges: None detected.              Reported side-effects: None.        Post-procedural adverse reactions or complications: None reported         Sedation: Please see nurses note. When no sedatives are used, the analgesic levels obtained are directly associated to the effectiveness of the local anesthetics. However, when sedation is provided, the level of analgesia obtained during the initial 1 hour following the intervention, is believed to be the result of a combination of factors. These factors may include, but are not limited to: 1. The effectiveness of the local anesthetics used. 2. The effects of the analgesic(s) and/or anxiolytic(s) used. 3. The degree of discomfort experienced by the patient at the time of the procedure. 4. The patients ability and reliability in recalling and recording the events. 5. The presence and influence of possible secondary gains and/or psychosocial factors. Reported result: Relief experienced during the 1st hour after the procedure: 100 % (Ultra-Short Term Relief)            Interpretative annotation: Clinically appropriate result. Analgesia during this period is likely to be Local Anesthetic and/or IV Sedative (  Analgesic/Anxiolytic) related.          Effects of local anesthetic: The analgesic effects attained during this period are directly associated to the localized infiltration of local anesthetics and therefore cary significant diagnostic value as to the etiological location, or anatomical origin, of the pain. Expected duration of  relief is directly dependent on the pharmacodynamics of the local anesthetic used. Long-acting (4-6 hours) anesthetics used.  Reported result: Relief during the next 4 to 6 hour after the procedure: 100 % (Short-Term Relief)            Interpretative annotation: Clinically appropriate result. Analgesia during this period is likely to be Local Anesthetic-related.          Long-term benefit: Defined as the period of time past the expected duration of local anesthetics (1 hour for short-acting and 4-6 hours for long-acting). With the possible exception of prolonged sympathetic blockade from the local anesthetics, benefits during this period are typically attributed to, or associated with, other factors such as analgesic sensory neuropraxia, antiinflammatory effects, or beneficial biochemical changes provided by agents other than the local anesthetics.  Reported result: Extended relief following procedure: 100 %(pain relief lasted approx 1 week. ) (Long-Term Relief)            Interpretative annotation: Clinically possible results. Good relief. No permanent benefit expected. Inflammation plays a part in the etiology to the pain.          Current benefits: Defined as reported results that persistent at this point in time.   Analgesia: 50 %            Function: Somewhat improved ROM: Somewhat improved Interpretative annotation: Recurrence of symptoms. No permanent benefit expected. Effective diagnostic intervention.          Interpretation: Results would suggest a successful diagnostic intervention.                  Plan:  Repeat treatment or therapy and compare extent and duration of benefits.                Laboratory Chemistry  Inflammation Markers (CRP: Acute Phase) (ESR: Chronic Phase) No results found for: CRP, ESRSEDRATE, LATICACIDVEN                       Rheumatology Markers No results found for: RF, ANA, LABURIC, URICUR, LYMEIGGIGMAB, LYMEABIGMQN, HLAB27                      Renal  Function Markers Lab Results  Component Value Date   BUN 20 01/26/2018   CREATININE 0.75 01/26/2018   GFRAA >60 01/26/2018   GFRNONAA >60 01/26/2018                             Hepatic Function Markers Lab Results  Component Value Date   AST 45 (H) 01/26/2018   ALT 53 (H) 01/26/2018   ALBUMIN 4.0 01/26/2018   ALKPHOS 67 01/26/2018                        Electrolytes Lab Results  Component Value Date   NA 138 01/26/2018   K 3.8 01/26/2018   CL 107 01/26/2018   CALCIUM 9.0 01/26/2018                        Neuropathy Markers No results found for:  VITAMINB12, FOLATE, HGBA1C, HIV                      CNS Tests No results found for: COLORCSF, APPEARCSF, RBCCOUNTCSF, WBCCSF, POLYSCSF, LYMPHSCSF, EOSCSF, PROTEINCSF, GLUCCSF, JCVIRUS, CSFOLI, IGGCSF                      Bone Pathology Markers No results found for: VD25OH, [id number], [id number], [id number], 25OHVITD1, 25OHVITD2, 25OHVITD3, TESTOFREE, TESTOSTERONE                       Coagulation Parameters Lab Results  Component Value Date   INR 1.04 01/26/2018   LABPROT 13.5 01/26/2018   APTT 31 01/26/2018   PLT 263 01/26/2018                        Cardiovascular Markers Lab Results  Component Value Date   TROPONINI <0.03 01/26/2018   HGB 13.5 01/26/2018   HCT 40.2 01/26/2018                         CA Markers No results found for: CEA, CA125, LABCA2                      Note: Lab results reviewed.  Recent Diagnostic Imaging Results  DG C-Arm 1-60 Min-No Report Fluoroscopy was utilized by the requesting physician.  No radiographic  interpretation.   Complexity Note: Imaging results reviewed. Results shared with Colleen Andrade, using Layman's terms.                         Meds   Current Outpatient Medications:  .  DULoxetine (CYMBALTA) 60 MG capsule, Take 1 capsule (60 mg total) by mouth daily., Disp: 30 capsule, Rfl: 3 .  HYDROcodone-acetaminophen (NORCO) 10-325 MG tablet, Take 1 tablet by mouth daily  as needed for severe pain., Disp: 30 tablet, Rfl: 0 .  meloxicam (MOBIC) 15 MG tablet, Take 1 tablet (15 mg total) by mouth daily. (Patient taking differently: Take 7.5 mg by mouth daily. ), Disp: 15 tablet, Rfl: 0 .  metFORMIN (GLUCOPHAGE) 1000 MG tablet, Take 1,000 mg by mouth 2 (two) times daily with a meal., Disp: , Rfl:  .  pregabalin (LYRICA) 75 MG capsule, Take 1 capsule (75 mg total) by mouth 3 (three) times daily., Disp: 90 capsule, Rfl: 3 .  sitaGLIPtin (JANUVIA) 100 MG tablet, Take 100 mg by mouth daily., Disp: , Rfl:  .  tiZANidine (ZANAFLEX) 2 MG tablet, Take 1 tablet (2 mg total) by mouth 2 (two) times daily as needed for muscle spasms., Disp: 30 tablet, Rfl: 3  ROS  Constitutional: Denies any fever or chills Gastrointestinal: No reported hemesis, hematochezia, vomiting, or acute GI distress Musculoskeletal: Denies any acute onset joint swelling, redness, loss of ROM, or weakness Neurological: No reported episodes of acute onset apraxia, aphasia, dysarthria, agnosia, amnesia, paralysis, loss of coordination, or loss of consciousness  Allergies  Colleen Andrade has No Known Allergies.  [place]  Drug: Colleen Andrade  reports that she does not use drugs. Alcohol:  reports that she does not drink alcohol. Tobacco:  reports that she has been smoking cigarettes. She has never used smokeless tobacco. Medical:  has a past medical history of Asthma and Diabetes mellitus without complication ([place]). Surgical: Colleen Andrade  has a past surgical history that includes Cholecystectomy. Family: family  history is not on file.  Constitutional Exam  General appearance: Well nourished, well developed, and well hydrated. In no apparent acute distress Vitals:   05/22/18 1351  BP: 131/69  Pulse: 65  Resp: 16  Temp: 98 F (36.7 C)  TempSrc: Oral  SpO2: 100%  Weight: 230 lb (104.3 kg)  Height: _0  (1.676 m)   BMI Assessment: Estimated body mass index is 37.12 kg/m as calculated from the  following:   Height as of this encounter: _1  (1.676 m).   Weight as of this encounter: 230 lb (104.3 kg).  BMI interpretation table: BMI level Category Range association with higher incidence of chronic pain  <18 kg/m2 Underweight   18.5-24.9 kg/m2 Ideal body weight   25-29.9 kg/m2 Overweight Increased incidence by 20%  30-34.9 kg/m2 Obese (Class I) Increased incidence by 68%  35-39.9 kg/m2 Severe obesity (Class II) Increased incidence by 136%  >40 kg/m2 Extreme obesity (Class III) Increased incidence by 254%   Patient's current BMI Ideal Body weight  Body mass index is 37.12 kg/m. Ideal body weight: 59.3 kg (130 lb 11.7 oz) Adjusted ideal body weight: 77.3 kg (170 lb 7 oz)   BMI Readings from Last 4 Encounters:  05/22/18 37.12 kg/m  04/24/18 37.12 kg/m  04/04/18 37.12 kg/m  03/20/18 38.58 kg/m   Wt Readings from Last 4 Encounters:  05/22/18 230 lb (104.3 kg)  04/24/18 230 lb (104.3 kg)  04/04/18 230 lb (104.3 kg)  03/20/18 239 lb (108.4 kg)  Psych/Mental status: Alert, oriented x 3 (person, place, & time)       Eyes: PERLA Respiratory: No evidence of acute respiratory distress  Cervical Spine Area Exam  Skin & Axial Inspection: No masses, redness, edema, swelling, or associated skin lesions Alignment: Symmetrical Functional ROM: Improved after treatment      Stability: No instability detected Muscle Tone/Strength: Functionally intact. No obvious neuro-muscular anomalies detected. Sensory (Neurological): Articular pain pattern Palpation: No palpable anomalies              Upper Extremity (UE) Exam    Side: Right upper extremity  Side: Left upper extremity  Skin & Extremity Inspection: Skin color, temperature, and hair growth are WNL. No peripheral edema or cyanosis. No masses, redness, swelling, asymmetry, or associated skin lesions. No contractures.  Skin & Extremity Inspection: Skin color, temperature, and hair growth are WNL. No peripheral edema or cyanosis. No  masses, redness, swelling, asymmetry, or associated skin lesions. No contractures.  Functional ROM: Unrestricted ROM          Functional ROM: Unrestricted ROM          Muscle Tone/Strength: Functionally intact. No obvious neuro-muscular anomalies detected.  Muscle Tone/Strength: Functionally intact. No obvious neuro-muscular anomalies detected.  Sensory (Neurological): Unimpaired          Sensory (Neurological): Unimpaired          Palpation: No palpable anomalies              Palpation: No palpable anomalies              Provocative Test(s):  Phalen's test: deferred Tinel's test: deferred Apley's scratch test (touch opposite shoulder):  Action 1 (Across chest): deferred Action 2 (Overhead): deferred Action 3 (LB reach): deferred   Provocative Test(s):  Phalen's test: deferred Tinel's test: deferred Apley's scratch test (touch opposite shoulder):  Action 1 (Across chest): deferred Action 2 (Overhead): deferred Action 3 (LB reach): deferred    Thoracic Spine Area Exam  Skin &  Axial Inspection: No masses, redness, or swelling Alignment: Symmetrical Functional ROM: Unrestricted ROM Stability: No instability detected Muscle Tone/Strength: Functionally intact. No obvious neuro-muscular anomalies detected. Sensory (Neurological): Unimpaired Muscle strength & Tone: No palpable anomalies  Lumbar Spine Area Exam  Skin & Axial Inspection: No masses, redness, or swelling Alignment: Symmetrical Functional ROM: Decreased ROM       Stability: No instability detected Muscle Tone/Strength: Functionally intact. No obvious neuro-muscular anomalies detected. Sensory (Neurological): Articular pain pattern Palpation: No palpable anomalies       Provocative Tests: Hyperextension/rotation test: (+) bilaterally for facet joint pain. Lumbar quadrant test (Kemp's test): (+) bilaterally for facet joint pain. Lateral bending test: deferred today       Patrick's Maneuver: deferred today                    FABER test: deferred today                   S-I anterior distraction/compression test: deferred today         S-I lateral compression test: deferred today         S-I Thigh-thrust test: deferred today         S-I Gaenslen's test: deferred today          Gait & Posture Assessment  Ambulation: Unassisted Gait: Relatively normal for age and body habitus Posture: WNL   Lower Extremity Exam    Side: Right lower extremity  Side: Left lower extremity  Stability: No instability observed          Stability: No instability observed          Skin & Extremity Inspection: Skin color, temperature, and hair growth are WNL. No peripheral edema or cyanosis. No masses, redness, swelling, asymmetry, or associated skin lesions. No contractures.  Skin & Extremity Inspection: Skin color, temperature, and hair growth are WNL. No peripheral edema or cyanosis. No masses, redness, swelling, asymmetry, or associated skin lesions. No contractures.  Functional ROM: Unrestricted ROM                  Functional ROM: Unrestricted ROM                  Muscle Tone/Strength: Functionally intact. No obvious neuro-muscular anomalies detected.  Muscle Tone/Strength: Functionally intact. No obvious neuro-muscular anomalies detected.  Sensory (Neurological): Unimpaired medial portion of foot (L4)  Sensory (Neurological): Unimpaired medial portion of foot (L4)  DTR: Patellar: deferred today Achilles: deferred today Plantar: deferred today  DTR: Patellar: deferred today Achilles: deferred today Plantar: deferred today  Palpation: No palpable anomalies  Palpation: No palpable anomalies   Assessment  Primary Diagnosis & Pertinent Problem List: The primary encounter diagnosis was Cervical spondylosis (R C4,5,6,7). Diagnoses of Cervicalgia, Cervical radiculopathy, and Chronic right-sided low back pain with right-sided sciatica were also pertinent to this visit.  Status Diagnosis  Responding Responding Persistent 1.  Cervical spondylosis (R C4,5,6,7)   2. Cervicalgia   3. Cervical radiculopathy   4. Chronic right-sided low back pain with right-sided sciatica      62 year old female with history of right-sided neck pain that radiates into her periscapular and shoulder region secondary to cervical spondylosis who follows up status post right C4, C5, C6, C7 cervical facet medial branch nerve blocks #1 who endorses approximately 60% pain relief for a week and a half in regards to her right neck pain.  She also endorses improvement in functional status, range of motion, ability to perform  activities of daily living.  Given the patient's positive diagnostic block #1, we discussed repeating this procedure.  Risks and benefits were again discussed.  Patient would like to proceed.  Depending upon results from second diagnostic block, can consider radiofrequency ablation.  Plan of Care   Lab-work, procedure(s), and/or referral(s): Orders Placed This Encounter  Procedures  . CERVICAL FACET (MEDIAL BRANCH NERVE BLOCK)    Time Note: Greater than 50% of the 25 minute(s) of face-to-face time spent with Colleen Andrade, was spent in counseling/coordination of care regarding: Colleen Andrade's primary cause of pain, the treatment plan, treatment alternatives, the risks and possible complications of proposed treatment, going over the informed consent, the results, interpretation and significance of  her recent diagnostic interventional treatment(s), realistic expectations and the goals of pain management (increased in functionality).  Provider-requested follow-up: Return in about 2 weeks (around 06/05/2018) for Procedure.  Future Appointments  Date Time Provider [place]  06/05/2018  1:15 PM Gillis Santa, MD [facility] None  06/05/2018  1:45 PM Gillis Santa, MD [facility] None    Primary Care Physician: Donald Prose, MD Location: [facility] Note by: Gillis Santa, M.D Date: 05/22/2018;  Time: 3:10 PM  Patient Instructions  ____________________________________________________________________________________________  Preparing for your procedure (without sedation)  Instructions: . Oral Intake: Do not eat or drink anything for at least 3 hours prior to your procedure. . Transportation: Unless otherwise stated by your physician, you may drive yourself after the procedure. . Blood Pressure Medicine: Take your blood pressure medicine with a sip of water the morning of the procedure. . Blood thinners: Notify our staff if you are taking any blood thinners. Depending on which one you take, there will be specific instructions on how and when to stop it. . Diabetics on insulin: Notify the staff so that you can be scheduled 1st case in the morning. If your diabetes requires high dose insulin, take only  of your normal insulin dose the morning of the procedure and notify the staff that you have done so. . Preventing infections: Shower with an antibacterial soap the morning of your procedure.  . Build-up your immune system: Take 1000 mg of Vitamin C with every meal (3 times a day) the day prior to your procedure. [place] Kitchen Antibiotics: Inform the staff if you have a condition or reason that requires you to take antibiotics before dental procedures. . Pregnancy: If you are pregnant, call and cancel the procedure. . Sickness: If you have a cold, fever, or any active infections, call and cancel the procedure. . Arrival: You must be in the facility at least 30 minutes prior to your scheduled procedure. . Children: Do not bring any children with you. . Dress appropriately: Bring dark clothing that you would not mind if they get stained. . Valuables: Do not bring any jewelry or valuables.  Procedure appointments are reserved for interventional treatments only. [place] Kitchen No Prescription Refills. . No medication changes will be discussed during procedure appointments. . No disability issues will be  discussed.  Reasons to call and reschedule or cancel your procedure: (Following these recommendations will minimize the risk of a serious complication.) . Surgeries: Avoid having procedures within 2 weeks of any surgery. (Avoid for 2 weeks before or after any surgery). . Flu Shots: Avoid having procedures within 2 weeks of a flu shots or . (Avoid for 2 weeks before or after immunizations). . Barium: Avoid having a procedure within 7-10 days after having had a radiological study involving the use  of radiological contrast. (Myelograms, Barium swallow or enema study). . Heart attacks: Avoid any elective procedures or surgeries for the initial 6 months after a "Myocardial Infarction" (Heart Attack). . Blood thinners: It is imperative that you stop these medications before procedures. Let us know if you if you take any blood thinner.  . Infection: Avoid procedures during or within two weeks of an infection (including chest colds or gastrointestinal problems). Symptoms associated with infections include: Localized redness, fever, chills, night sweats or profuse sweating, burning sensation when voiding, cough, congestion, stuffiness, runny nose, sore throat, diarrhea, nausea, vomiting, cold or Flu symptoms, recent or current infections. It is specially important if the infection is over the area that we intend to treat. Marland Kitchen Heart and lung problems: Symptoms that may suggest an active cardiopulmonary problem include: cough, chest pain, breathing difficulties or shortness of breath, dizziness, ankle swelling, uncontrolled high or unusually low blood pressure, and/or palpitations. If you are experiencing any of these symptoms, cancel your procedure and contact your primary care physician for an evaluation.  Remember:  Regular Business hours are:  Monday to Thursday 8:00 AM to 4:00 PM  Provider's Schedule: Milinda Pointer, MD:  Procedure days: Tuesday and Thursday 7:30 AM to 4:00 PM  Gillis Santa, MD:   Procedure days: Monday and Wednesday 7:30 AM to 4:00 PM ____________________________________________________________________________________________  ____________________________________________________________________________________________  Preparing for your procedure (without sedation)  Instructions: . Oral Intake: Do not eat or drink anything for at least 3 hours prior to your procedure. . Transportation: Unless otherwise stated by your physician, you may drive yourself after the procedure. . Blood Pressure Medicine: Take your blood pressure medicine with a sip of water the morning of the procedure. . Blood thinners: Notify our staff if you are taking any blood thinners. Depending on which one you take, there will be specific instructions on how and when to stop it. . Diabetics on insulin: Notify the staff so that you can be scheduled 1st case in the morning. If your diabetes requires high dose insulin, take only  of your normal insulin dose the morning of the procedure and notify the staff that you have done so. . Preventing infections: Shower with an antibacterial soap the morning of your procedure.  . Build-up your immune system: Take 1000 mg of Vitamin C with every meal (3 times a day) the day prior to your procedure. Marland Kitchen Antibiotics: Inform the staff if you have a condition or reason that requires you to take antibiotics before dental procedures. . Pregnancy: If you are pregnant, call and cancel the procedure. . Sickness: If you have a cold, fever, or any active infections, call and cancel the procedure. . Arrival: You must be in the facility at least 30 minutes prior to your scheduled procedure. . Children: Do not bring any children with you. . Dress appropriately: Bring dark clothing that you would not mind if they get stained. . Valuables: Do not bring any jewelry or valuables.  Procedure appointments are reserved for interventional treatments only. Marland Kitchen No Prescription  Refills. . No medication changes will be discussed during procedure appointments. . No disability issues will be discussed.  Reasons to call and reschedule or cancel your procedure: (Following these recommendations will minimize the risk of a serious complication.) . Surgeries: Avoid having procedures within 2 weeks of any surgery. (Avoid for 2 weeks before or after any surgery). . Flu Shots: Avoid having procedures within 2 weeks of a flu shots or . (Avoid for 2 weeks before or  after immunizations). . Barium: Avoid having a procedure within 7-10 days after having had a radiological study involving the use of radiological contrast. (Myelograms, Barium swallow or enema study). . Heart attacks: Avoid any elective procedures or surgeries for the initial 6 months after a "Myocardial Infarction" (Heart Attack). . Blood thinners: It is imperative that you stop these medications before procedures. Let us know if you if you take any blood thinner.  . Infection: Avoid procedures during or within two weeks of an infection (including chest colds or gastrointestinal problems). Symptoms associated with infections include: Localized redness, fever, chills, night sweats or profuse sweating, burning sensation when voiding, cough, congestion, stuffiness, runny nose, sore throat, diarrhea, nausea, vomiting, cold or Flu symptoms, recent or current infections. It is specially important if the infection is over the area that we intend to treat. Marland Kitchen Heart and lung problems: Symptoms that may suggest an active cardiopulmonary problem include: cough, chest pain, breathing difficulties or shortness of breath, dizziness, ankle swelling, uncontrolled high or unusually low blood pressure, and/or palpitations. If you are experiencing any of these symptoms, cancel your procedure and contact your primary care physician for an evaluation.  Remember:  Regular Business hours are:  Monday to Thursday 8:00 AM to 4:00 PM  Provider's  Schedule: Milinda Pointer, MD:  Procedure days: Tuesday and Thursday 7:30 AM to 4:00 PM  Gillis Santa, MD:  Procedure days: Monday and Wednesday 7:30 AM to 4:00 PM ____________________________________________________________________________________________  ____________________________________________________________________________________________  Preparing for your procedure (without sedation)  Instructions: . Oral Intake: Do not eat or drink anything for at least 3 hours prior to your procedure. . Transportation: Unless otherwise stated by your physician, you may drive yourself after the procedure. . Blood Pressure Medicine: Take your blood pressure medicine with a sip of water the morning of the procedure. . Blood thinners: Notify our staff if you are taking any blood thinners. Depending on which one you take, there will be specific instructions on how and when to stop it. . Diabetics on insulin: Notify the staff so that you can be scheduled 1st case in the morning. If your diabetes requires high dose insulin, take only  of your normal insulin dose the morning of the procedure and notify the staff that you have done so. . Preventing infections: Shower with an antibacterial soap the morning of your procedure.  . Build-up your immune system: Take 1000 mg of Vitamin C with every meal (3 times a day) the day prior to your procedure. Marland Kitchen Antibiotics: Inform the staff if you have a condition or reason that requires you to take antibiotics before dental procedures. . Pregnancy: If you are pregnant, call and cancel the procedure. . Sickness: If you have a cold, fever, or any active infections, call and cancel the procedure. . Arrival: You must be in the facility at least 30 minutes prior to your scheduled procedure. . Children: Do not bring any children with you. . Dress appropriately: Bring dark clothing that you would not mind if they get stained. . Valuables: Do not bring any jewelry or  valuables.  Procedure appointments are reserved for interventional treatments only. Marland Kitchen No Prescription Refills. . No medication changes will be discussed during procedure appointments. . No disability issues will be discussed.  Reasons to call and reschedule or cancel your procedure: (Following these recommendations will minimize the risk of a serious complication.) . Surgeries: Avoid having procedures within 2 weeks of any surgery. (Avoid for 2 weeks before or after any surgery). Marland Kitchen  Flu Shots: Avoid having procedures within 2 weeks of a flu shots or . (Avoid for 2 weeks before or after immunizations). . Barium: Avoid having a procedure within 7-10 days after having had a radiological study involving the use of radiological contrast. (Myelograms, Barium swallow or enema study). . Heart attacks: Avoid any elective procedures or surgeries for the initial 6 months after a "Myocardial Infarction" (Heart Attack). . Blood thinners: It is imperative that you stop these medications before procedures. Let us know if you if you take any blood thinner.  . Infection: Avoid procedures during or within two weeks of an infection (including chest colds or gastrointestinal problems). Symptoms associated with infections include: Localized redness, fever, chills, night sweats or profuse sweating, burning sensation when voiding, cough, congestion, stuffiness, runny nose, sore throat, diarrhea, nausea, vomiting, cold or Flu symptoms, recent or current infections. It is specially important if the infection is over the area that we intend to treat. Marland Kitchen Heart and lung problems: Symptoms that may suggest an active cardiopulmonary problem include: cough, chest pain, breathing difficulties or shortness of breath, dizziness, ankle swelling, uncontrolled high or unusually low blood pressure, and/or palpitations. If you are experiencing any of these symptoms, cancel your procedure and contact your primary care physician for an  evaluation.  Remember:  Regular Business hours are:  Monday to Thursday 8:00 AM to 4:00 PM  Provider's Schedule: Milinda Pointer, MD:  Procedure days: Tuesday and Thursday 7:30 AM to 4:00 PM  Gillis Santa, MD:  Procedure days: Monday and Wednesday 7:30 AM to 4:00 PM ____________________________________________________________________________________________  Facet Blocks Patient Information  Description: The facets are joints in the spine between the vertebrae.  Like any joints in the body, facets can become irritated and painful.  Arthritis can also effect the facets.  By injecting steroids and local anesthetic in and around these joints, we can temporarily block the nerve supply to them.  Steroids act directly on irritated nerves and tissues to reduce selling and inflammation which often leads to decreased pain.  Facet blocks may be done anywhere along the spine from the neck to the low back depending upon the location of your pain.   After numbing the skin with local anesthetic (like Novocaine), a small needle is passed onto the facet joints under x-ray guidance.  You may experience a sensation of pressure while this is being done.  The entire block usually lasts about 15-25 minutes.   Conditions which may be treated by facet blocks:   Low back/buttock pain  Neck/shoulder pain  Certain types of headaches  Preparation for the injection:  1. Do not eat any solid food or dairy products within 8 hours of your appointment. 2. You may drink clear liquid up to 3 hours before appointment.  Clear liquids include water, black coffee, juice or soda.  No milk or cream please. 3. You may take your regular medication, including pain medications, with a sip of water before your appointment.  Diabetics should hold regular insulin (if taken separately) and take 1/2 normal NPH dose the morning of the procedure.  Carry some sugar containing items with you to your appointment. 4. A driver must  accompany you and be prepared to drive you home after your procedure. 5. Bring all your current medications with you. 6. An IV may be inserted and sedation may be given at the discretion of the physician. 7. A blood pressure cuff, EKG and other monitors will often be applied during the procedure.  Some patients may  need to have extra oxygen administered for a short period. 8. You will be asked to provide medical information, including your allergies and medications, prior to the procedure.  We must know immediately if you are taking blood thinners (like Coumadin/Warfarin) or if you are allergic to IV iodine contrast (dye).  We must know if you could possible be pregnant.  Possible side-effects:   Bleeding from needle site  Infection (rare, may require surgery)  Nerve injury (rare)  Numbness & tingling (temporary)  Difficulty urinating (rare, temporary)  Spinal headache (a headache worse with upright posture)  Light-headedness (temporary)  Pain at injection site (serveral days)  Decreased blood pressure (rare, temporary)  Weakness in arm/leg (temporary)  Pressure sensation in back/neck (temporary)   Call if you experience:   Fever/chills associated with headache or increased back/neck pain  Headache worsened by an upright position  New onset, weakness or numbness of an extremity below the injection site  Hives or difficulty breathing (go to the emergency room)  Inflammation or drainage at the injection site(s)  Severe back/neck pain greater than usual  New symptoms which are concerning to you  Please note:  Although the local anesthetic injected can often make your back or neck feel good for several hours after the injection, the pain will likely return. It takes 3-7 days for steroids to work.  You may not notice any pain relief for at least one week.  If effective, we will often do a series of 2-3 injections spaced 3-6 weeks apart to maximally decrease your pain.   After the initial series, you may be a candidate for a more permanent nerve block of the facets.  If you have any questions, please call #[phone]) [facility]

## 2018-06-05 ENCOUNTER — Encounter: Payer: Self-pay | Admitting: Student in an Organized Health Care Education/Training Program

## 2018-06-05 ENCOUNTER — Ambulatory Visit (HOSPITAL_BASED_OUTPATIENT_CLINIC_OR_DEPARTMENT_OTHER): Payer: BLUE CROSS/BLUE SHIELD | Admitting: Student in an Organized Health Care Education/Training Program

## 2018-06-05 ENCOUNTER — Ambulatory Visit
Admission: RE | Admit: 2018-06-05 | Discharge: 2018-06-05 | Disposition: A | Discharge status: Home / Self Care | Payer: BLUE CROSS/BLUE SHIELD | Source: Ambulatory Visit | Attending: Student in an Organized Health Care Education/Training Program | Admitting: Student in an Organized Health Care Education/Training Program

## 2018-06-05 VITALS — BP 156/79 | HR 75 | Temp 97.8°F | Resp 18 | Ht 66.0 in | Wt 225.0 lb

## 2018-06-05 DIAGNOSIS — M47812 Spondylosis without myelopathy or radiculopathy, cervical region: Secondary | ICD-10-CM | POA: Diagnosis not present

## 2018-06-05 MED ORDER — LIDOCAINE HCL 2 % IJ SOLN
INTRAMUSCULAR | Status: AC
  Filled 2018-06-05: qty 20

## 2018-06-05 MED ORDER — DEXAMETHASONE SODIUM PHOSPHATE 10 MG/ML IJ SOLN
INTRAMUSCULAR | Status: AC
  Filled 2018-06-05: qty 1

## 2018-06-05 MED ORDER — DEXAMETHASONE SODIUM PHOSPHATE 10 MG/ML IJ SOLN
10.0000 mg | Freq: Once | INTRAMUSCULAR | Status: AC
  Administered 2018-06-05: 10 mg

## 2018-06-05 MED ORDER — ROPIVACAINE HCL 2 MG/ML IJ SOLN
10.0000 mL | Freq: Once | INTRAMUSCULAR | Status: AC
  Administered 2018-06-05: 10 mL

## 2018-06-05 MED ORDER — ROPIVACAINE HCL 2 MG/ML IJ SOLN
INTRAMUSCULAR | Status: AC
  Filled 2018-06-05: qty 10

## 2018-06-05 MED ORDER — LIDOCAINE HCL 2 % IJ SOLN
20.0000 mL | Freq: Once | INTRAMUSCULAR | Status: AC
  Administered 2018-06-05: 400 mg

## 2018-06-05 NOTE — Patient Instructions (Signed)

## 2018-06-05 NOTE — Progress Notes (Signed)
Patient's Name: Colleen Andrade  MRN: [id number]  Referring Provider: Deatra James, MD  DOB: 08-03-1955  PCP: Deatra James, MD  DOS: 06/05/2018  Note by: Edward Jolly, MD  Service setting: Ambulatory outpatient  Specialty: Interventional Pain Management  Patient type: Established  Location: [facility] (AMB) Pain Management Facility  Visit type: Interventional Procedure   Primary Reason for Visit: Interventional Pain Management Treatment. CC: Neck Pain (right side )  Procedure:          Anesthesia, Analgesia, Anxiolysis:  Type: Cervical Facet Medial Branch Block(s)  #2  Primary Purpose: Diagnostic Region: Posterolateral cervical spine Level:C4, C5, C6, & C7 Medial Branch Level(s). Injecting these levels blocks the C4-5, C5-6, and C6-7 cervical facet joints. Laterality: Right  Type: Local Anesthesia Indication(s): Analgesia         Route: Infiltration ([place]/IM) IV Access: Declined Sedation: Declined  Local Anesthetic: Lidocaine 1-2%  Position: Prone with head of the table raised to facilitate breathing.   Indications: 1. Cervical spondylosis (R C4,5,6,7)    Pain Score: Pre-procedure: 4 /10 Post-procedure: 5 /10  Pre-op Assessment:  Ms. Klett is a 62 y.o. (year old), female patient, seen today for interventional treatment. She  has a past surgical history that includes Cholecystectomy. Ms. Marlowe has a current medication list which includes the following prescription(s): duloxetine, hydrocodone-acetaminophen, meloxicam, metformin, pregabalin, sitagliptin, and tizanidine. Her primarily concern today is the Neck Pain (right side )  Initial Vital Signs:  Pulse/HCG Rate: 75ECG Heart Rate: 73 Temp: 97.8 F (36.6 C) Resp: 16 BP: (!) 148/73 SpO2: 100 %  BMI: Estimated body mass index is 36.32 kg/m as calculated from the following:   Height as of this encounter: 5\' 6"  (1.676 m).   Weight as of this encounter: 225 lb (102.1 kg).  Risk Assessment: Allergies: Reviewed. She has No Known  Allergies.  Allergy Precautions: None required Coagulopathies: Reviewed. None identified.  Blood-thinner therapy: None at this time Active Infection(s): Reviewed. None identified. Ms. Simek is afebrile  Site Confirmation: Ms. Tafolla was asked to confirm the procedure and laterality before marking the site Procedure checklist: Completed Consent: Before the procedure and under the influence of no sedative(s), amnesic(s), or anxiolytics, the patient was informed of the treatment options, risks and possible complications. To fulfill our ethical and legal obligations, as recommended by the American Medical Association's Code of Ethics, I have informed the patient of my clinical impression; the nature and purpose of the treatment or procedure; the risks, benefits, and possible complications of the intervention; the alternatives, including doing nothing; the risk(s) and benefit(s) of the alternative treatment(s) or procedure(s); and the risk(s) and benefit(s) of doing nothing. The patient was provided information about the general risks and possible complications associated with the procedure. These may include, but are not limited to: failure to achieve desired goals, infection, bleeding, organ or nerve damage, allergic reactions, paralysis, and death. In addition, the patient was informed of those risks and complications associated to Spine-related procedures, such as failure to decrease pain; infection (i.e.: Meningitis, epidural or intraspinal abscess); bleeding (i.e.: epidural hematoma, subarachnoid hemorrhage, or any other type of intraspinal or peri-dural bleeding); organ or nerve damage (i.e.: Any type of peripheral nerve, nerve root, or spinal cord injury) with subsequent damage to sensory, motor, and/or autonomic systems, resulting in permanent pain, numbness, and/or weakness of one or several areas of the body; allergic reactions; (i.e.: anaphylactic reaction); and/or death. Furthermore, the  patient was informed of those risks and complications associated with the medications. These include, but  are not limited to: allergic reactions (i.e.: anaphylactic or anaphylactoid reaction(s)); adrenal axis suppression; blood sugar elevation that in diabetics may result in ketoacidosis or comma; water retention that in patients with history of congestive heart failure may result in shortness of breath, pulmonary edema, and decompensation with resultant heart failure; weight gain; swelling or edema; medication-induced neural toxicity; particulate matter embolism and blood vessel occlusion with resultant organ, and/or nervous system infarction; and/or aseptic necrosis of one or more joints. Finally, the patient was informed that Medicine is not an exact science; therefore, there is also the possibility of unforeseen or unpredictable risks and/or possible complications that may result in a catastrophic outcome. The patient indicated having understood very clearly. We have given the patient no guarantees and we have made no promises. Enough time was given to the patient to ask questions, all of which were answered to the patient's satisfaction. Ms. Ames DuraGuilfuchi has indicated that she wanted to continue with the procedure. Attestation: I, the ordering provider, attest that I have discussed with the patient the benefits, risks, side-effects, alternatives, likelihood of achieving goals, and potential problems during recovery for the procedure that I have provided informed consent. Date  Time: 06/05/2018  1:12 PM  Pre-Procedure Preparation:  Monitoring: As per clinic protocol. Respiration, ETCO2, SpO2, BP, heart rate and rhythm monitor placed and checked for adequate function Safety Precautions: Patient was assessed for positional comfort and pressure points before starting the procedure. Time-out: I initiated and conducted the "Time-out" before starting the procedure, as per protocol. The patient was asked to  participate by confirming the accuracy of the "Time Out" information. Verification of the correct person, site, and procedure were performed and confirmed by me, the nursing staff, and the patient. "Time-out" conducted as per Joint Commission's Universal Protocol (UP.01.01.01). Time: 1336  Description of Procedure:          Laterality: Right Level: C4, C5, C6, & C7 Medial Branch Level(s). Area Prepped: Posterior Cervico-thoracic Region Prepping solution: ChloraPrep (2% chlorhexidine gluconate and 70% isopropyl alcohol) Safety Precautions: Aspiration looking for blood return was conducted prior to all injections. At no point did we inject any substances, as a needle was being advanced. Before injecting, the patient was told to immediately notify me if she was experiencing any new onset of "ringing in the ears, or metallic taste in the mouth". No attempts were made at seeking any paresthesias. Safe injection practices and needle disposal techniques used. Medications properly checked for expiration dates. SDV (single dose vial) medications used. After the completion of the procedure, all disposable equipment used was discarded in the proper designated medical waste containers. Local Anesthesia: Protocol guidelines were followed. The patient was positioned over the fluoroscopy table. The area was prepped in the usual manner. The time-out was completed. The target area was identified using fluoroscopy. A 12-in long, straight, sterile hemostat was used with fluoroscopic guidance to locate the targets for each level blocked. Once located, the skin was marked with an approved surgical skin marker. Once all sites were marked, the skin (epidermis, dermis, and hypodermis), as well as deeper tissues (fat, connective tissue and muscle) were infiltrated with a small amount of a short-acting local anesthetic, loaded on a 10cc syringe with a 25G, 1.5-in  Needle. An appropriate amount of time was allowed for local  anesthetics to take effect before proceeding to the next step. Local Anesthetic: Lidocaine 2.0% The unused portion of the local anesthetic was discarded in the proper designated containers. Technical explanation of process:  C4 Medial Branch Nerve Block (MBB): The target area for the C4 dorsal medial articular branch is the lateral concave waist of the articular pillar of C4. Under fluoroscopic guidance, a Quincke needle was inserted until contact was made with os over the postero-lateral aspect of the articular pillar of C4 (target area). After negative aspiration for blood, 1 mL of the nerve block solution was injected without difficulty or complication. The needle was removed intact. C5 Medial Branch Nerve Block (MBB): The target area for the C5 dorsal medial articular branch is the lateral concave waist of the articular pillar of C5. Under fluoroscopic guidance, a Quincke needle was inserted until contact was made with os over the postero-lateral aspect of the articular pillar of C5 (target area). After negative aspiration for blood, 1mL of the nerve block solution was injected without difficulty or complication. The needle was removed intact. C6 Medial Branch Nerve Block (MBB): The target area for the C6 dorsal medial articular branch is the lateral concave waist of the articular pillar of C6. Under fluoroscopic guidance, a Quincke needle was inserted until contact was made with os over the postero-lateral aspect of the articular pillar of C6 (target area). After negative aspiration for blood, 1 mL of the nerve block solution was injected without difficulty or complication. The needle was removed intact. C7 Medial Branch Nerve Block (MBB): The target for the C7 dorsal medial articular branch lies on the superior-medial tip of the C7 transverse process. Under fluoroscopic guidance, a Quincke needle was inserted until contact was made with os over the postero-lateral aspect of the articular pillar of C7  (target area). After negative aspiration for blood, 1mL of the nerve block solution was injected without difficulty or complication. The needle was removed intact. Procedural Needles: 22-gauge, 3.5-inch, Quincke needles used for all levels. Nerve block solution: 5 cc solution made of 4 cc of 0.2% ropivacaine, 1 cc of Decadron 10 mg/cc. 1 cc injected at each level above on the right.  The unused portion of the solution was discarded in the proper designated containers.  Once the entire procedure was completed, the treated area was cleaned, making sure to leave some of the prepping solution back to take advantage of its long term bactericidal properties.  Vitals:   06/05/18 1317 06/05/18 1339 06/05/18 1344 06/05/18 1354  BP: (!) 148/73 (!) 156/93 (!) 164/77 (!) 156/79  Pulse: 75     Resp: 16 13 14 18   Temp: 97.8 F (36.6 C)     TempSrc: Oral     SpO2: 100% 95% 96% 97%  Weight: 225 lb (102.1 kg)     Height: 5\' 6"  (1.676 m)       Start Time: 1336 hrs. End Time: 1349 hrs.  Imaging Guidance (Spinal):          Type of Imaging Technique: Fluoroscopy Guidance (Spinal) Indication(s): Assistance in needle guidance and placement for procedures requiring needle placement in or near specific anatomical locations not easily accessible without such assistance. Exposure Time: Please see nurses notes. Contrast: None used. Fluoroscopic Guidance: I was personally present during the use of fluoroscopy. "Tunnel Vision Technique" used to obtain the best possible view of the target area. Parallax error corrected before commencing the procedure. "Direction-depth-direction" technique used to introduce the needle under continuous pulsed fluoroscopy. Once target was reached, antero-posterior, oblique, and lateral fluoroscopic projection used confirm needle placement in all planes. Images permanently stored in EMR. Interpretation: No contrast injected. I personally interpreted the imaging intraoperatively. Adequate  needle  placement confirmed in multiple planes. Permanent images saved into the patient's record.  Antibiotic Prophylaxis:   Anti-infectives (From admission, onward)   None     Indication(s): None identified  Post-operative Assessment:  Post-procedure Vital Signs:  Pulse/HCG Rate: 7575 Temp: 97.8 F (36.6 C) Resp: 18 BP: (!) 156/79 SpO2: 97 %  EBL: None  Complications: No immediate post-treatment complications observed by team, or reported by patient.  Note: The patient tolerated the entire procedure well. A repeat set of vitals were taken after the procedure and the patient was kept under observation following institutional policy, for this type of procedure. Post-procedural neurological assessment was performed, showing return to baseline, prior to discharge. The patient was provided with post-procedure discharge instructions, including a section on how to identify potential problems. Should any problems arise concerning this procedure, the patient was given instructions to immediately contact us, at any time, without hesitation. In any case, we plan to contact the patient by telephone for a follow-up status report regarding this interventional procedure.  Comments:  No additional relevant information. 5 out of 5 strength bilateral upper extremity: Shoulder abduction, elbow flexion, elbow extension, thumb extension.  Plan of Care    Imaging Orders     DG C-Arm 1-60 Min-No Report Procedure Orders    No procedure(s) ordered today    Medications ordered for procedure: Meds ordered this encounter  Medications  . ropivacaine (PF) 2 mg/mL (0.2%) (NAROPIN) injection 10 mL  . lidocaine (XYLOCAINE) 2 % (with pres) injection 400 mg  . dexamethasone (DECADRON) injection 10 mg   Medications administered: We administered ropivacaine (PF) 2 mg/mL (0.2%), lidocaine, and dexamethasone.  See the medical record for exact dosing, route, and time of administration.  Disposition:  Discharge home  Discharge Date & Time: 06/05/2018; 1400 hrs.   Physician-requested Follow-up: Return in about 5 weeks (around 07/10/2018) for Post Procedure Evaluation.  Future Appointments  Date Time Provider Department Center  07/10/2018  1:45 PM Edward Jolly, MD [facility] None   Primary Care Physician: Deatra James, MD Location: [facility] Note by: Edward Jolly, MD Date: 06/05/2018; Time: 2:11 PM  Disclaimer:  Medicine is not an exact science. The only guarantee in medicine is that nothing is guaranteed. It is important to note that the decision to proceed with this intervention was based on the information collected from the patient. The Data and conclusions were drawn from the patient's questionnaire, the interview, and the physical examination. Because the information was provided in large part by the patient, it cannot be guaranteed that it has not been purposely or unconsciously manipulated. Every effort has been made to obtain as much relevant data as possible for this evaluation. It is important to note that the conclusions that lead to this procedure are derived in large part from the available data. Always take into account that the treatment will also be dependent on availability of resources and existing treatment guidelines, considered by other Pain Management Practitioners as being common knowledge and practice, at the time of the intervention. For Medico-Legal purposes, it is also important to point out that variation in procedural techniques and pharmacological choices are the acceptable norm. The indications, contraindications, technique, and results of the above procedure should only be interpreted and judged by a Board-Certified Interventional Pain Specialist with extensive familiarity and expertise in the same exact procedure and technique.

## 2018-06-05 NOTE — Progress Notes (Signed)
Safety precautions to be maintained throughout the outpatient stay will include: orient to surroundings, keep bed in low position, maintain call bell within reach at all times, provide assistance with transfer out of bed and ambulation.  

## 2018-06-06 ENCOUNTER — Telehealth: Payer: Self-pay

## 2018-06-06 NOTE — Telephone Encounter (Signed)
Post procedure phone call.  LM 

## 2018-07-10 ENCOUNTER — Ambulatory Visit
Payer: BLUE CROSS/BLUE SHIELD | Attending: Student in an Organized Health Care Education/Training Program | Admitting: Student in an Organized Health Care Education/Training Program

## 2018-07-10 ENCOUNTER — Encounter: Payer: Self-pay | Admitting: Student in an Organized Health Care Education/Training Program

## 2018-07-10 ENCOUNTER — Other Ambulatory Visit: Payer: Self-pay

## 2018-07-10 VITALS — BP 123/53 | HR 66 | Temp 97.9°F | Resp 18 | Ht 66.0 in | Wt 230.0 lb

## 2018-07-10 DIAGNOSIS — M542 Cervicalgia: Secondary | ICD-10-CM | POA: Insufficient documentation

## 2018-07-10 DIAGNOSIS — M5441 Lumbago with sciatica, right side: Secondary | ICD-10-CM | POA: Insufficient documentation

## 2018-07-10 DIAGNOSIS — Z79899 Other long term (current) drug therapy: Secondary | ICD-10-CM | POA: Insufficient documentation

## 2018-07-10 DIAGNOSIS — M5412 Radiculopathy, cervical region: Secondary | ICD-10-CM

## 2018-07-10 DIAGNOSIS — E118 Type 2 diabetes mellitus with unspecified complications: Secondary | ICD-10-CM | POA: Insufficient documentation

## 2018-07-10 DIAGNOSIS — G8929 Other chronic pain: Secondary | ICD-10-CM

## 2018-07-10 MED ORDER — HYDROCODONE-ACETAMINOPHEN 10-325 MG PO TABS: 1.0000 | ORAL_TABLET | Freq: Every day | ORAL | 0 refills | Status: AC | PRN

## 2018-07-10 MED ORDER — PREGABALIN 100 MG PO CAPS: 100.0000 mg | ORAL_CAPSULE | Freq: Three times a day (TID) | ORAL | 2 refills | Status: DC

## 2018-07-10 MED ORDER — PREGABALIN 100 MG PO CAPS: 100.0000 mg | ORAL_CAPSULE | Freq: Three times a day (TID) | ORAL | 0 refills | Status: DC

## 2018-07-10 NOTE — Progress Notes (Signed)
Patient's Name: Colleen Andrade  MRN: [id number]  Referring Provider: Donald Prose, MD  DOB: 1955-12-21  PCP: Donald Prose, MD  DOS: 07/10/2018  Note by: Gillis Santa, MD  Service setting: Ambulatory outpatient  Specialty: Interventional Pain Management  Location: [facility] (AMB) Pain Management Facility    Patient type: Established   Primary Reason(s) for Visit: Encounter for prescription drug management & post-procedure evaluation of chronic illness with mild to moderate exacerbation(Level of risk: moderate) CC: Back Pain (right upper quadrant) and Neck Pain  HPI  Colleen Andrade is a 63 y.o. year old, female patient, who comes today for a post-procedure evaluation and medication management. She has Cervical radiculopathy; Cervicalgia; Degenerative disc disease, cervical; Chronic right-sided low back pain with right-sided sciatica; Chronic right SI joint pain; and Chronic pain syndrome on their problem list. Her primarily concern today is the Back Pain (right upper quadrant) and Neck Pain  Pain Assessment: Location: Right Back Radiating: through right shoulder to neck and down right arm to fingers, upper arm aches, lower arm to fingers tingles Onset: More than a month ago Duration: Chronic pain Quality: Constant, Aching, Tingling Severity: 8 /10 (subjective, self-reported pain score)  Note: Reported level is inconsistent with clinical observations. Clinically the patient looks like a 3/10 A 3/10 is viewed as "Moderate" and described as significantly interfering with activities of daily living (ADL). It becomes difficult to feed, bathe, get dressed, get on and off the toilet or to perform personal hygiene functions. Difficult to get in and out of bed or a chair without assistance. Very distracting. With effort, it can be ignored when deeply involved in activities. Information on the proper use of the pain scale provided to the patient today. When using our objective Pain Scale, levels between 6 and 10/10 are  said to belong in an emergency room, as it progressively worsens from a 6/10, described as severely limiting, requiring emergency care not usually available at an outpatient pain management facility. At a 6/10 level, communication becomes difficult and requires great effort. Assistance to reach the emergency department may be required. Facial flushing and profuse sweating along with potentially dangerous increases in heart rate and blood pressure will be evident. Effect on ADL: "pain is exhausting", difficult to sleep, limits daily activities Timing: Constant Modifying factors: meds BP: (!) 123/53  HR: 66  Colleen Andrade was last seen on 06/06/2018 for a procedure. During today's appointment we reviewed Colleen Andrade's post-procedure results, as well as her outpatient medication regimen.  Patient endorsing worsening pain in her right neck that radiates down her right shoulder to her right fingers.  She is endorsing burning and tingling sensation.  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  Likely need repeat cervical MRI.  Further details on both, my assessment(s), as well as the proposed treatment plan, please see below.  Controlled Substance Pharmacotherapy Assessment REMS (Risk Evaluation and Mitigation Strategy)  Analgesic: Hydrocodone 5 mg twice daily PRN MME/day: 10 mg/day.  Rise Patience, RN  07/10/2018  2:06 PM  Signed Nursing Pain Medication Assessment:  Safety precautions to be maintained throughout the outpatient stay will include: orient to surroundings, keep bed in low position, maintain call bell within reach at all times, provide assistance with transfer out of bed and ambulation.  Medication Inspection Compliance: Colleen Andrade did not comply with our request to bring her pills to be counted. She was reminded that bringing the medication bottles, even when empty, is a requirement.  Medication:  None brought  in. Pill/Patch Count: None available to be counted. Bottle Appearance: No container available. Did not bring bottle(s) to appointment. Filled Date: N/A Last Medication intake:  Yesterday   Pt v/o need to bring medication bottle to appt;, states she forgot to do so today.   Pharmacokinetics: Liberation and absorption (onset of action): WNL Distribution (time to peak effect): WNL Metabolism and excretion (duration of action): WNL         Pharmacodynamics: Desired effects: Analgesia: Colleen Andrade reports >50% benefit. Functional ability: Patient reports that medication allows her to accomplish basic ADLs Clinically meaningful improvement in function (CMIF): Sustained CMIF goals met Perceived effectiveness: Described as relatively effective, allowing for increase in activities of daily living (ADL) Undesirable effects: Side-effects or Adverse reactions: None reported Monitoring: [place] PMP: Online review of the past 57-monthperiod conducted. Compliant with practice rules and regulations Last UDS on record: Summary  Date Value Ref Range Status  03/20/2018 FINAL  Final    Comment:    ==================================================================== [facility] COMP DRUG ANALYSIS,UR ==================================================================== Test                             Result       Flag       Units Drug Present and Declared for Prescription Verification   Hydrocodone                    1685         EXPECTED   ng/mg creat   Dihydrocodeine                 116          EXPECTED   ng/mg creat   Norhydrocodone                 1169         EXPECTED   ng/mg creat    Sources of hydrocodone include scheduled prescription    medications. Dihydrocodeine and norhydrocodone are expected    metabolites of hydrocodone. Dihydrocodeine is also available as a    scheduled prescription medication.   Pregabalin                     PRESENT      EXPECTED   Tizanidine                      PRESENT      EXPECTED   Duloxetine                     PRESENT      EXPECTED   Acetaminophen                  PRESENT      EXPECTED ==================================================================== Test                      Result    Flag   Units      Ref Range   Creatinine              219              mg/dL      >=20 ==================================================================== Declared Medications:  The flagging and interpretation on this report are based on the  following declared medications.  Unexpected results may arise from  inaccuracies in the declared medications.  **Note: The testing scope of this  panel includes these medications:  Duloxetine  Hydrocodone (Hydrocodone-Acetaminophen)  Pregabalin  **Note: The testing scope of this panel does not include small to  moderate amounts of these reported medications:  Acetaminophen (Hydrocodone-Acetaminophen)  Tizanidine  **Note: The testing scope of this panel does not include following  reported medications:  Meloxicam  Metformin  Prednisone  Sitagliptin ==================================================================== For clinical consultation, please call [phone]. ====================================================================    UDS interpretation: Compliant          Medication Assessment Form: Reviewed. Patient indicates being compliant with therapy Treatment compliance: Compliant Risk Assessment Profile: Aberrant behavior: See prior evaluations. None observed or detected today Comorbid factors increasing risk of overdose: See prior notes. No additional risks detected today Opioid risk tool (ORT) (Total Score): 0 Personal History of Substance Abuse (SUD-Substance use disorder):  Alcohol: Negative  Illegal Drugs: Negative  Rx Drugs: Negative  ORT Risk Level calculation: Low Risk Risk of substance use disorder (SUD): Low Opioid Risk Tool - 07/10/18 1401      Family History of Substance Abuse    Alcohol  Negative    Illegal Drugs  Negative    Rx Drugs  Negative      Personal History of Substance Abuse   Alcohol  Negative    Illegal Drugs  Negative    Rx Drugs  Negative      Age   Age between 76-45 years   No      History of Preadolescent Sexual Abuse   History of Preadolescent Sexual Abuse  Negative or Female      Psychological Disease   Psychological Disease  Negative    Depression  Negative      Total Score   Opioid Risk Tool Scoring  0    Opioid Risk Interpretation  Low Risk      ORT Scoring interpretation table:  Score <3 = Low Risk for SUD  Score between 4-7 = Moderate Risk for SUD  Score >8 = High Risk for Opioid Abuse   Risk Mitigation Strategies:  Patient Counseling: Covered Patient-Prescriber Agreement (PPA): Present and active  Notification to other healthcare providers: Done  Pharmacologic Plan: No change in therapy, at this time.             Post-Procedure Assessment  06/05/2018 Procedure: Right C4, 5, 6, 7 cervical facet medial branch block Pre-procedure pain score:  4/10 Post-procedure pain score: 5/10         Influential Factors: BMI: 37.12 kg/m Intra-procedural challenges: None observed.         Assessment challenges: None detected.              Reported side-effects: None.        Post-procedural adverse reactions or complications: None reported         Sedation: Please see nurses note. When no sedatives are used, the analgesic levels obtained are directly associated to the effectiveness of the local anesthetics. However, when sedation is provided, the level of analgesia obtained during the initial 1 hour following the intervention, is believed to be the result of a combination of factors. These factors may include, but are not limited to: 1. The effectiveness of the local anesthetics used. 2. The effects of the analgesic(s) and/or anxiolytic(s) used. 3. The degree of discomfort experienced by the patient at the time of the procedure. 4.  The patients ability and reliability in recalling and recording the events. 5. The presence and influence of possible secondary gains and/or psychosocial factors. Reported result: Relief experienced  during the 1st hour after the procedure: 100 % (Ultra-Short Term Relief)            Interpretative annotation: Clinically appropriate result. Analgesia during this period is likely to be Local Anesthetic and/or IV Sedative (Analgesic/Anxiolytic) related.          Effects of local anesthetic: The analgesic effects attained during this period are directly associated to the localized infiltration of local anesthetics and therefore cary significant diagnostic value as to the etiological location, or anatomical origin, of the pain. Expected duration of relief is directly dependent on the pharmacodynamics of the local anesthetic used. Long-acting (4-6 hours) anesthetics used.  Reported result: Relief during the next 4 to 6 hour after the procedure: 100 % (Short-Term Relief)            Interpretative annotation: Clinically appropriate result. Analgesia during this period is likely to be Local Anesthetic-related.          Long-term benefit: Defined as the period of time past the expected duration of local anesthetics (1 hour for short-acting and 4-6 hours for long-acting). With the possible exception of prolonged sympathetic blockade from the local anesthetics, benefits during this period are typically attributed to, or associated with, other factors such as analgesic sensory neuropraxia, antiinflammatory effects, or beneficial biochemical changes provided by agents other than the local anesthetics.  Reported result: Extended relief following procedure: 100 %(X 4 days) (Long-Term Relief)            Interpretative annotation: Clinically possible results. Good relief. No permanent benefit expected. Inflammation plays a part in the etiology to the pain.          Current benefits: Defined as reported results that  persistent at this point in time.   Analgesia: 25-50 %            Function: Somewhat improved ROM: Somewhat improved Interpretative annotation: Recurrence of symptoms. No permanent benefit expected. Effective diagnostic intervention.          Interpretation: Results would suggest a successful diagnostic intervention.                  Plan:  Please see "Plan of Care" for details.                Laboratory Chemistry  Inflammation Markers (CRP: Acute Phase) (ESR: Chronic Phase) No results found for: CRP, ESRSEDRATE, LATICACIDVEN                       Rheumatology Markers No results found for: RF, ANA, LABURIC, URICUR, LYMEIGGIGMAB, LYMEABIGMQN, HLAB27                      Renal Function Markers Lab Results  Component Value Date   BUN 20 01/26/2018   CREATININE 0.75 01/26/2018   GFRAA >60 01/26/2018   GFRNONAA >60 01/26/2018                             Hepatic Function Markers Lab Results  Component Value Date   AST 45 (H) 01/26/2018   ALT 53 (H) 01/26/2018   ALBUMIN 4.0 01/26/2018   ALKPHOS 67 01/26/2018                        Electrolytes Lab Results  Component Value Date   NA 138 01/26/2018   K 3.8 01/26/2018   CL 107 01/26/2018  CALCIUM 9.0 01/26/2018                        Neuropathy Markers No results found for: VITAMINB12, FOLATE, HGBA1C, HIV                      CNS Tests No results found for: COLORCSF, APPEARCSF, RBCCOUNTCSF, WBCCSF, POLYSCSF, LYMPHSCSF, EOSCSF, PROTEINCSF, GLUCCSF, JCVIRUS, CSFOLI, IGGCSF                      Bone Pathology Markers No results found for: VD25OH, [id number], [id number], [id number], 25OHVITD1, 25OHVITD2, 25OHVITD3, TESTOFREE, TESTOSTERONE                       Coagulation Parameters Lab Results  Component Value Date   INR 1.04 01/26/2018   LABPROT 13.5 01/26/2018   APTT 31 01/26/2018   PLT 263 01/26/2018                        Cardiovascular Markers Lab Results  Component Value Date   TROPONINI <0.03 01/26/2018    HGB 13.5 01/26/2018   HCT 40.2 01/26/2018                         CA Markers No results found for: CEA, CA125, LABCA2                      Note: Lab results reviewed.  Recent Diagnostic Imaging Results  DG C-Arm 1-60 Min-No Report Fluoroscopy was utilized by the requesting physician.  No radiographic  interpretation.   Complexity Note: Imaging results reviewed. Results shared with Ms. Debenedetto, using Layman's terms.                         Meds   Current Outpatient Medications:  .  DULoxetine (CYMBALTA) 60 MG capsule, Take 1 capsule (60 mg total) by mouth daily., Disp: 30 capsule, Rfl: 3 .  HYDROcodone-acetaminophen (NORCO) 10-325 MG tablet, Take 1 tablet by mouth daily as needed., Disp: 30 tablet, Rfl: 0 .  meloxicam (MOBIC) 15 MG tablet, Take 1 tablet (15 mg total) by mouth daily. (Patient taking differently: Take 7.5 mg by mouth daily. ), Disp: 15 tablet, Rfl: 0 .  metFORMIN (GLUCOPHAGE) 1000 MG tablet, Take 1,000 mg by mouth 2 (two) times daily with a meal., Disp: , Rfl:  .  sitaGLIPtin (JANUVIA) 100 MG tablet, Take 100 mg by mouth daily., Disp: , Rfl:  .  tiZANidine (ZANAFLEX) 2 MG tablet, Take 1 tablet (2 mg total) by mouth 2 (two) times daily as needed for muscle spasms., Disp: 30 tablet, Rfl: 3 .  pregabalin (LYRICA) 100 MG capsule, Take 1 capsule (100 mg total) by mouth 3 (three) times daily., Disp: 90 capsule, Rfl: 2  ROS  Constitutional: Denies any fever or chills Gastrointestinal: No reported hemesis, hematochezia, vomiting, or acute GI distress Musculoskeletal: Denies any acute onset joint swelling, redness, loss of ROM, or weakness Neurological: No reported episodes of acute onset apraxia, aphasia, dysarthria, agnosia, amnesia, paralysis, loss of coordination, or loss of consciousness  Allergies  Ms. Deboard has No Known Allergies.  [place]  Drug: Ms. Citron  reports no history of drug use. Alcohol:  reports no history of alcohol use. Tobacco:  reports that  she has been smoking cigarettes. She has never used smokeless tobacco. Medical:  has a past medical history of Asthma and Diabetes mellitus without complication ([place]). Surgical: Ms. Pick  has a past surgical history that includes Cholecystectomy. Family: family history is not on file.  Constitutional Exam  General appearance: Well nourished, well developed, and well hydrated. In no apparent acute distress Vitals:   07/10/18 1348  BP: (!) 123/53  Pulse: 66  Resp: 18  Temp: 97.9 F (36.6 C)  TempSrc: Oral  SpO2: 100%  Weight: 230 lb (104.3 kg)  Height: '5\' 6"'$  (1.676 m)   BMI Assessment: Estimated body mass index is 37.12 kg/m as calculated from the following:   Height as of this encounter: '5\' 6"'$  (1.676 m).   Weight as of this encounter: 230 lb (104.3 kg).  BMI interpretation table: BMI level Category Range association with higher incidence of chronic pain  <18 kg/m2 Underweight   18.5-24.9 kg/m2 Ideal body weight   25-29.9 kg/m2 Overweight Increased incidence by 20%  30-34.9 kg/m2 Obese (Class I) Increased incidence by 68%  35-39.9 kg/m2 Severe obesity (Class II) Increased incidence by 136%  >40 kg/m2 Extreme obesity (Class III) Increased incidence by 254%   Patient's current BMI Ideal Body weight  Body mass index is 37.12 kg/m. Ideal body weight: 59.3 kg (130 lb 11.7 oz) Adjusted ideal body weight: 77.3 kg (170 lb 7 oz)   BMI Readings from Last 4 Encounters:  07/10/18 37.12 kg/m  06/05/18 36.32 kg/m  05/22/18 37.12 kg/m  04/24/18 37.12 kg/m   Wt Readings from Last 4 Encounters:  07/10/18 230 lb (104.3 kg)  06/05/18 225 lb (102.1 kg)  05/22/18 230 lb (104.3 kg)  04/24/18 230 lb (104.3 kg)  Psych/Mental status: Alert, oriented x 3 (person, place, & time)       Eyes: PERLA Respiratory: No evidence of acute respiratory distress  Cervical Spine Area Exam  Skin & Axial Inspection: No masses, redness, edema, swelling, or associated skin lesions Alignment:  Symmetrical Functional ROM: Pain restricted ROM, to the right Stability: No instability detected Muscle Tone/Strength: Functionally intact. No obvious neuro-muscular anomalies detected. Sensory (Neurological): Dermatomal pain pattern right C4, C5, C6 Palpation: No palpable anomalies              Upper Extremity (UE) Exam    Side: Right upper extremity  Side: Left upper extremity  Skin & Extremity Inspection: Skin color, temperature, and hair growth are WNL. No peripheral edema or cyanosis. No masses, redness, swelling, asymmetry, or associated skin lesions. No contractures.  Skin & Extremity Inspection: Skin color, temperature, and hair growth are WNL. No peripheral edema or cyanosis. No masses, redness, swelling, asymmetry, or associated skin lesions. No contractures.  Functional ROM: Pain restricted ROM for all joints of upper extremity  Functional ROM: Unrestricted ROM          Muscle Tone/Strength: Functionally intact. No obvious neuro-muscular anomalies detected.  Muscle Tone/Strength: Functionally intact. No obvious neuro-muscular anomalies detected.  Sensory (Neurological): Neuropathic pain pattern          Sensory (Neurological): Unimpaired          Palpation: No palpable anomalies              Palpation: No palpable anomalies              Provocative Test(s):  Phalen's test: deferred Tinel's test: deferred Apley's scratch test (touch opposite shoulder):  Action 1 (Across chest): Decreased ROM Action 2 (Overhead): Decreased ROM Action 3 (LB reach): Decreased ROM   Provocative Test(s):  Phalen's test: deferred  Tinel's test: deferred Apley's scratch test (touch opposite shoulder):  Action 1 (Across chest): deferred Action 2 (Overhead): deferred Action 3 (LB reach): deferred    Thoracic Spine Area Exam  Skin & Axial Inspection: No masses, redness, or swelling Alignment: Symmetrical Functional ROM: Unrestricted ROM Stability: No instability detected Muscle Tone/Strength:  Functionally intact. No obvious neuro-muscular anomalies detected. Sensory (Neurological): Unimpaired Muscle strength & Tone: No palpable anomalies  Lumbar Spine Area Exam  Skin & Axial Inspection: No masses, redness, or swelling Alignment: Symmetrical Functional ROM: Unrestricted ROM       Stability: No instability detected Muscle Tone/Strength: Functionally intact. No obvious neuro-muscular anomalies detected. Sensory (Neurological): Unimpaired Palpation: No palpable anomalies       Provocative Tests: Hyperextension/rotation test: deferred today       Lumbar quadrant test (Kemp's test): deferred today       Lateral bending test: deferred today       Patrick's Maneuver: deferred today                   FABER* test: deferred today                   S-I anterior distraction/compression test: deferred today         S-I lateral compression test: deferred today         S-I Thigh-thrust test: deferred today         S-I Gaenslen's test: deferred today         *(Flexion, ABduction and External Rotation)  Gait & Posture Assessment  Ambulation: Unassisted Gait: Relatively normal for age and body habitus Posture: WNL   Lower Extremity Exam    Side: Right lower extremity  Side: Left lower extremity  Stability: No instability observed          Stability: No instability observed          Skin & Extremity Inspection: Skin color, temperature, and hair growth are WNL. No peripheral edema or cyanosis. No masses, redness, swelling, asymmetry, or associated skin lesions. No contractures.  Skin & Extremity Inspection: Skin color, temperature, and hair growth are WNL. No peripheral edema or cyanosis. No masses, redness, swelling, asymmetry, or associated skin lesions. No contractures.  Functional ROM: Unrestricted ROM                  Functional ROM: Unrestricted ROM                  Muscle Tone/Strength: Functionally intact. No obvious neuro-muscular anomalies detected.  Muscle Tone/Strength:  Functionally intact. No obvious neuro-muscular anomalies detected.  Sensory (Neurological): Unimpaired        Sensory (Neurological): Unimpaired        DTR: Patellar: deferred today Achilles: deferred today Plantar: deferred today  DTR: Patellar: deferred today Achilles: deferred today Plantar: deferred today  Palpation: No palpable anomalies  Palpation: No palpable anomalies   Assessment  Primary Diagnosis & Pertinent Problem List: The primary encounter diagnosis was Cervical radiculopathy. Diagnoses of Cervicalgia, Chronic right-sided low back pain with right-sided sciatica, Controlled substance agreement signed, and Type 2 diabetes mellitus with complication, without long-term current use of insulin ([place]) were also pertinent to this visit.  Status Diagnosis  Worsening Persistent Persistent 1. Cervical radiculopathy   2. Cervicalgia   3. Chronic right-sided low back pain with right-sided sciatica   4. Controlled substance agreement signed   5. Type 2 diabetes mellitus with complication, without long-term current use of insulin ([place])  General Recommendations: The pain condition that the patient suffers from is best treated with a multidisciplinary approach that involves an increase in physical activity to prevent de-conditioning and worsening of the pain cycle, as well as psychological counseling (formal and/or informal) to address the co-morbid psychological affects of pain. Treatment will often involve judicious use of pain medications and interventional procedures to decrease the pain, allowing the patient to participate in the physical activity that will ultimately produce long-lasting pain reductions. The goal of the multidisciplinary approach is to return the patient to a higher level of overall function and to restore their ability to perform activities of daily living.  Endorsing symptoms of worsening right-sided neck pain that radiates to her right forearm and hand.  This is  likely related to cervical radiculopathy.  Patient's previous cervical MRI was in 2015 and did show neuroforaminal narrowing and stenosis in multiple cervical regions on the right.  Recommend repeat cervical MRI without contrast.  Depending upon results, patient may be a candidate for cervical epidural steroid injection.  Also recommend the patient increase her Lyrica to 100 mg 3 times a day.  She is not noticing any side effects at her current dose and is endorsing worsening numbness and tingling in her hands.  I will also refill the patient's hydrocodone as below.  Plan: -Cervical MRI without contrast to evaluate for cervical neuroforaminal stenosis, cervical radiculopathy -Increase Lyrica 100 mg 3 times a day -Refill of hydrocodone as below -Follow-up in 3 to 4 weeks after imaging.  Consider cervical ESI  Plan of Care  Pharmacotherapy (Medications Ordered): Meds ordered this encounter  Medications  . DISCONTD: pregabalin (LYRICA) 100 MG capsule    Sig: Take 1 capsule (100 mg total) by mouth 3 (three) times daily.    Dispense:  90 capsule    Refill:  0    Do not place this medication, or any other prescription from our practice, on "Automatic Refill". Patient may have prescription filled one day early if pharmacy is closed on scheduled refill date.  . pregabalin (LYRICA) 100 MG capsule    Sig: Take 1 capsule (100 mg total) by mouth 3 (three) times daily.    Dispense:  90 capsule    Refill:  2    Do not place this medication, or any other prescription from our practice, on "Automatic Refill". Patient may have prescription filled one day early if pharmacy is closed on scheduled refill date.  Marland Kitchen HYDROcodone-acetaminophen (NORCO) 10-325 MG tablet    Sig: Take 1 tablet by mouth daily as needed.    Dispense:  30 tablet    Refill:  0   Lab-work, procedure(s), and/or referral(s): Orders Placed This Encounter  Procedures  . MR CERVICAL SPINE WO CONTRAST    Considering:   Cervical ESI  pending cervical MRI Consider right lumbar facet medial branch nerve blocks at L3-S1 (lumbar pain) Consider right SI joint injection (SI joint pain)   Provider-requested follow-up: Return in about 3 weeks (around 07/31/2018) for Medication Management, After Imaging.  Future Appointments  Date Time Provider [place]  08/01/2018  9:45 AM Gillis Santa, MD [facility] None    Primary Care Physician: Donald Prose, MD Location: [facility] Note by: Gillis Santa, M.D Date: 07/10/2018; Time: 2:50 PM  Patient Instructions  1.  Increase your Lyrica 100 mg 3 times a day.  If you notice any side effects at this higher dose such as sedation, swelling please reduce back to 100 mg twice a  day  2.  Given worsening symptoms of your right arm, we will obtain cervical MRI.  3.  Refill of hydrocodone at its previous dose.  Hydrocodone with acetaminophen to last until 08/09/2018 and lyrica have been escribed to your pharmacy.

## 2018-07-10 NOTE — Patient Instructions (Addendum)
1.  Increase your Lyrica 100 mg 3 times a day.  If you notice any side effects at this higher dose such as sedation, swelling please reduce back to 100 mg twice a day  2.  Given worsening symptoms of your right arm, we will obtain cervical MRI.  3.  Refill of hydrocodone at its previous dose.  Hydrocodone with acetaminophen to last until 08/09/2018 and lyrica have been escribed to your pharmacy.

## 2018-07-10 NOTE — Progress Notes (Signed)
Nursing Pain Medication Assessment:  Safety precautions to be maintained throughout the outpatient stay will include: orient to surroundings, keep bed in low position, maintain call bell within reach at all times, provide assistance with transfer out of bed and ambulation.  Medication Inspection Compliance: Ms. Giorno did not comply with our request to bring her pills to be counted. She was reminded that bringing the medication bottles, even when empty, is a requirement.  Medication: None brought in. Pill/Patch Count: None available to be counted. Bottle Appearance: No container available. Did not bring bottle(s) to appointment. Filled Date: N/A Last Medication intake:  Yesterday   Pt v/o need to bring medication bottle to appt;, states she forgot to do so today.

## 2018-07-26 ENCOUNTER — Telehealth: Payer: Self-pay | Admitting: *Deleted

## 2018-08-01 ENCOUNTER — Encounter: Payer: Self-pay | Admitting: Student in an Organized Health Care Education/Training Program

## 2018-08-02 ENCOUNTER — Ambulatory Visit: Admission: RE | Admit: 2018-08-02 | Payer: Self-pay | Source: Ambulatory Visit

## 2018-08-02 ENCOUNTER — Telehealth: Payer: Self-pay | Admitting: Student in an Organized Health Care Education/Training Program

## 2018-08-02 NOTE — Telephone Encounter (Signed)
Radiology called stating the patient was a no show for her MRI

## 2018-08-31 ENCOUNTER — Encounter: Payer: Self-pay | Admitting: Student in an Organized Health Care Education/Training Program

## 2018-08-31 ENCOUNTER — Other Ambulatory Visit: Payer: Self-pay

## 2018-08-31 ENCOUNTER — Ambulatory Visit
Payer: Self-pay | Attending: Student in an Organized Health Care Education/Training Program | Admitting: Student in an Organized Health Care Education/Training Program

## 2018-08-31 ENCOUNTER — Encounter (INDEPENDENT_AMBULATORY_CARE_PROVIDER_SITE_OTHER): Payer: Self-pay

## 2018-08-31 VITALS — BP 136/64 | HR 67 | Temp 98.1°F | Resp 18 | Ht 66.0 in | Wt 217.0 lb

## 2018-08-31 DIAGNOSIS — E118 Type 2 diabetes mellitus with unspecified complications: Secondary | ICD-10-CM | POA: Insufficient documentation

## 2018-08-31 DIAGNOSIS — Z79899 Other long term (current) drug therapy: Secondary | ICD-10-CM | POA: Insufficient documentation

## 2018-08-31 DIAGNOSIS — G8929 Other chronic pain: Secondary | ICD-10-CM | POA: Insufficient documentation

## 2018-08-31 DIAGNOSIS — M533 Sacrococcygeal disorders, not elsewhere classified: Secondary | ICD-10-CM | POA: Insufficient documentation

## 2018-08-31 DIAGNOSIS — M5441 Lumbago with sciatica, right side: Secondary | ICD-10-CM | POA: Insufficient documentation

## 2018-08-31 DIAGNOSIS — M542 Cervicalgia: Secondary | ICD-10-CM | POA: Insufficient documentation

## 2018-08-31 DIAGNOSIS — G894 Chronic pain syndrome: Secondary | ICD-10-CM | POA: Insufficient documentation

## 2018-08-31 DIAGNOSIS — M47816 Spondylosis without myelopathy or radiculopathy, lumbar region: Secondary | ICD-10-CM | POA: Insufficient documentation

## 2018-08-31 DIAGNOSIS — M47812 Spondylosis without myelopathy or radiculopathy, cervical region: Secondary | ICD-10-CM | POA: Insufficient documentation

## 2018-08-31 DIAGNOSIS — M5412 Radiculopathy, cervical region: Secondary | ICD-10-CM | POA: Insufficient documentation

## 2018-08-31 MED ORDER — HYDROCODONE-ACETAMINOPHEN 10-325 MG PO TABS: 1.0000 | ORAL_TABLET | Freq: Every day | ORAL | 0 refills | Status: AC | PRN

## 2018-08-31 MED ORDER — HYDROCODONE-ACETAMINOPHEN 10-325 MG PO TABS: 1.0000 | ORAL_TABLET | Freq: Every day | ORAL | 0 refills | Status: DC | PRN

## 2018-08-31 MED ORDER — PREGABALIN 100 MG PO CAPS: 100.0000 mg | ORAL_CAPSULE | Freq: Three times a day (TID) | ORAL | 2 refills | Status: DC

## 2018-08-31 NOTE — Patient Instructions (Signed)
Lyrica with 2 refills and hydrocodone with acetaminophen to last until 11/29/2018 has been escribed to your pharmacy.

## 2018-08-31 NOTE — Progress Notes (Signed)
Patient's Name: Colleen Andrade  MRN: [id number]  Referring Provider: Donald Prose, MD  DOB: 01-Oct-1955  PCP: Donald Prose, MD  DOS: 08/31/2018  Note by: Gillis Santa, MD  Service setting: Ambulatory outpatient  Specialty: Interventional Pain Management  Location: [facility] (AMB) Pain Management Facility    Patient type: Established   Primary Reason(s) for Visit: Encounter for prescription drug management. (Level of risk: moderate)  CC: Shoulder Pain (right)  HPI  Colleen Andrade is a 63 y.o. year old, female patient, who comes today for a medication management evaluation. She has Cervical radiculopathy; Cervicalgia; Degenerative disc disease, cervical; Chronic right-sided low back pain with right-sided sciatica; Chronic right SI joint pain; and Chronic pain syndrome on their problem list. Her primarily concern today is the Shoulder Pain (right)  Pain Assessment: Location: Right Shoulder Radiating: from neck down right arem to fingers, upper arm aches, lower arm to fingers tingle/numbness Onset: More than a month ago Duration: Chronic pain Quality: Constant Severity: 8 /10 (subjective, self-reported pain score)  Note: Reported level is inconsistent with clinical observations.                         When using our objective Pain Scale, levels between 6 and 10/10 are said to belong in an emergency room, as it progressively worsens from a 6/10, described as severely limiting, requiring emergency care not usually available at an outpatient pain management facility. At a 6/10 level, communication becomes difficult and requires great effort. Assistance to reach the emergency department may be required. Facial flushing and profuse sweating along with potentially dangerous increases in heart rate and blood pressure will be evident. Effect on ADL: difficult to sleep, limits daily activities Timing: Constant(pain in neck and shoulder constant, numbness and tingling in lower am to fingers comes and goes.) Modifying  factors: meds BP: 136/64  HR: 67  Colleen Andrade was last scheduled for an appointment on 08/02/2018 for medication management. During today's appointment we reviewed Colleen Andrade's chronic pain status, as well as her outpatient medication regimen.  Patient follows up today for medication management.  At her last visit, a cervical MRI was ordered however due to insurance issues, patient was unable to obtain given out-of-pocket amount of over $3000.  She states that she is in between jobs and also moving at this is a very stressful time for her.  The patient  reports no history of drug use. Her body mass index is 35.02 kg/m.  Further details on both, my assessment(s), as well as the proposed treatment plan, please see below.  Controlled Substance Pharmacotherapy Assessment REMS (Risk Evaluation and Mitigation Strategy)  Analgesic: Hydrocodone 10 mg daily PRN MME/day: 10 mg/day.  Colleen Patience, RN  08/31/2018  2:16 PM  Signed Nursing Pain Medication Assessment:  Safety precautions to be maintained throughout the outpatient stay will include: orient to surroundings, keep bed in low position, maintain call bell within reach at all times, provide assistance with transfer out of bed and ambulation.  Medication Inspection Compliance: Pill count conducted under aseptic conditions, in front of the patient. Neither the pills nor the bottle was removed from the patient's sight at any time. Once count was completed pills were immediately returned to the patient in their original bottle.  Medication: Hydrocodone/APAP Pill/Patch Count: 1 of 30 pills remain Pill/Patch Appearance: Markings consistent with prescribed medication Bottle Appearance: Standard pharmacy container. Clearly labeled. Filled Date: 1 / 6 / 2020 Last Medication intake:  Ran out of medicine  more than 48 hours ago Pharmacokinetics: Liberation and absorption (onset of action): WNL Distribution (time to peak effect): WNL Metabolism and  excretion (duration of action): WNL         Pharmacodynamics: Desired effects: Analgesia: Colleen Andrade reports >50% benefit. Functional ability: Patient reports that medication allows her to accomplish basic ADLs Clinically meaningful improvement in function (CMIF): Sustained CMIF goals met Perceived effectiveness: Described as relatively effective but with some room for improvement patient sometimes needs an extra dose in the evening especially if she has engaged in physical activity during the day. Undesirable effects: Side-effects or Adverse reactions: None reported Monitoring: [place] PMP: Online review of the past 32-monthperiod conducted. Compliant with practice rules and regulations Last UDS on record: Summary  Date Value Ref Range Status  03/20/2018 FINAL  Final    Comment:    ==================================================================== [facility] COMP DRUG ANALYSIS,UR ==================================================================== Test                             Result       Flag       Units Drug Present and Declared for Prescription Verification   Hydrocodone                    1685         EXPECTED   ng/mg creat   Dihydrocodeine                 116          EXPECTED   ng/mg creat   Norhydrocodone                 1169         EXPECTED   ng/mg creat    Sources of hydrocodone include scheduled prescription    medications. Dihydrocodeine and norhydrocodone are expected    metabolites of hydrocodone. Dihydrocodeine is also available as a    scheduled prescription medication.   Pregabalin                     PRESENT      EXPECTED   Tizanidine                     PRESENT      EXPECTED   Duloxetine                     PRESENT      EXPECTED   Acetaminophen                  PRESENT      EXPECTED ==================================================================== Test                      Result    Flag   Units      Ref Range   Creatinine              219               mg/dL      >=20 ==================================================================== Declared Medications:  The flagging and interpretation on this report are based on the  following declared medications.  Unexpected results may arise from  inaccuracies in the declared medications.  **Note: The testing scope of this panel includes these medications:  Duloxetine  Hydrocodone (Hydrocodone-Acetaminophen)  Pregabalin  **Note: The testing scope of this panel does not include small to  moderate amounts  of these reported medications:  Acetaminophen (Hydrocodone-Acetaminophen)  Tizanidine  **Note: The testing scope of this panel does not include following  reported medications:  Meloxicam  Metformin  Prednisone  Sitagliptin ==================================================================== For clinical consultation, please call [phone]. ====================================================================    UDS interpretation: Compliant          Medication Assessment Form: Reviewed. Patient indicates being compliant with therapy Treatment compliance: Compliant Risk Assessment Profile: Aberrant behavior: See initial evaluations. None observed or detected today Comorbid factors increasing risk of overdose: See initial evaluation. No additional risks detected today Opioid risk tool (ORT):  Opioid Risk  08/31/2018  Alcohol 0  Illegal Drugs 0  Rx Drugs 0  Alcohol 0  Illegal Drugs 0  Rx Drugs 0  Age between 16-45 years  0  History of Preadolescent Sexual Abuse 0  Psychological Disease 0  Depression 0  Opioid Risk Tool Scoring 0  Opioid Risk Interpretation Low Risk    ORT Scoring interpretation table:  Score <3 = Low Risk for SUD  Score between 4-7 = Moderate Risk for SUD  Score >8 = High Risk for Opioid Abuse   Risk of substance use disorder (SUD): Low  Risk Mitigation Strategies:  Patient Counseling: Covered Patient-Prescriber Agreement (PPA): Present and active   Notification to other healthcare providers: Done  Pharmacologic Plan: Therapy adjustment: Increase hydrocodone quantity from 30/month to 45/month  Laboratory Chemistry    Renal Function Markers Lab Results  Component Value Date   BUN 20 01/26/2018   CREATININE 0.75 01/26/2018   GFRAA >60 01/26/2018   GFRNONAA >60 01/26/2018                             Hepatic Function Markers Lab Results  Component Value Date   AST 45 (H) 01/26/2018   ALT 53 (H) 01/26/2018   ALBUMIN 4.0 01/26/2018   ALKPHOS 67 01/26/2018                        Electrolytes Lab Results  Component Value Date   NA 138 01/26/2018   K 3.8 01/26/2018   CL 107 01/26/2018   CALCIUM 9.0 01/26/2018                        Neuropathy Markers No results found for: VITAMINB12, FOLATE, HGBA1C, HIV                      CNS Tests No results found for: COLORCSF, APPEARCSF, RBCCOUNTCSF, WBCCSF, POLYSCSF, LYMPHSCSF, EOSCSF, PROTEINCSF, GLUCCSF, JCVIRUS, CSFOLI, IGGCSF                      Bone Pathology Markers No results found for: VD25OH, [id number], [id number], [id number], 25OHVITD1, 25OHVITD2, 25OHVITD3, TESTOFREE, TESTOSTERONE                       Coagulation Parameters Lab Results  Component Value Date   INR 1.04 01/26/2018   LABPROT 13.5 01/26/2018   APTT 31 01/26/2018   PLT 263 01/26/2018                        Cardiovascular Markers Lab Results  Component Value Date   TROPONINI <0.03 01/26/2018   HGB 13.5 01/26/2018   HCT 40.2 01/26/2018  CA Markers No results found for: CEA, CA125, LABCA2                      Endocrine Markers No results found for: TSH, FREET4, TESTOFREE, TESTOSTERONE, ESTRADIOL, ESTRADIOLPCT, ESTRADIOLFRE                      Note: Lab results reviewed.  Recent Diagnostic Imaging Results  DG C-Arm 1-60 Min-No Report Fluoroscopy was utilized by the requesting physician.  No radiographic  interpretation.   Complexity Note: Imaging results  reviewed. Results shared with Ms. Marrow, using Layman's terms.                         Meds   Current Outpatient Medications:  .  DULoxetine (CYMBALTA) 60 MG capsule, Take 1 capsule (60 mg total) by mouth daily., Disp: 30 capsule, Rfl: 3 .  HYDROcodone-acetaminophen (NORCO) 10-325 MG tablet, Take 1-2 tablets by mouth daily as needed for up to 30 days., Disp: 45 tablet, Rfl: 0 .  meloxicam (MOBIC) 15 MG tablet, Take 1 tablet (15 mg total) by mouth daily. (Patient taking differently: Take 7.5 mg by mouth daily. ), Disp: 15 tablet, Rfl: 0 .  metFORMIN (GLUCOPHAGE) 1000 MG tablet, Take 1,000 mg by mouth 2 (two) times daily with a meal., Disp: , Rfl:  .  pregabalin (LYRICA) 100 MG capsule, Take 1 capsule (100 mg total) by mouth 3 (three) times daily., Disp: 90 capsule, Rfl: 2 .  sitaGLIPtin (JANUVIA) 100 MG tablet, Take 100 mg by mouth daily., Disp: , Rfl:  .  tiZANidine (ZANAFLEX) 2 MG tablet, Take 1 tablet (2 mg total) by mouth 2 (two) times daily as needed for muscle spasms., Disp: 30 tablet, Rfl: 3 .  [START ON 09/30/2018] HYDROcodone-acetaminophen (NORCO) 10-325 MG tablet, Take 1-2 tablets by mouth daily as needed for up to 30 days for severe pain. Must last 30 days., Disp: 45 tablet, Rfl: 0 .  [START ON 10/30/2018] HYDROcodone-acetaminophen (NORCO) 10-325 MG tablet, Take 1-2 tablets by mouth daily as needed for up to 30 days for severe pain. Must last 30 days., Disp: 45 tablet, Rfl: 0  ROS  Constitutional: Denies any fever or chills Gastrointestinal: No reported hemesis, hematochezia, vomiting, or acute GI distress Musculoskeletal: Denies any acute onset joint swelling, redness, loss of ROM, or weakness Neurological: No reported episodes of acute onset apraxia, aphasia, dysarthria, agnosia, amnesia, paralysis, loss of coordination, or loss of consciousness  Allergies  Ms. Woodin has No Known Allergies.  [place]  Drug: Ms. Foulkes  reports no history of drug use. Alcohol:  reports no  history of alcohol use. Tobacco:  reports that she has been smoking cigarettes. She has never used smokeless tobacco. Medical:  has a past medical history of Asthma and Diabetes mellitus without complication ([place]). Surgical: Ms. Schumm  has a past surgical history that includes Cholecystectomy. Family: family history is not on file.  Constitutional Exam  General appearance: Well nourished, well developed, and well hydrated. In no apparent acute distress Vitals:   08/31/18 1405  BP: 136/64  Pulse: 67  Resp: 18  Temp: 98.1 F (36.7 C)  TempSrc: Oral  SpO2: 100%  Weight: 217 lb (98.4 kg)  Height: '5\' 6"'$  (1.676 m)   BMI Assessment: Estimated body mass index is 35.02 kg/m as calculated from the following:   Height as of this encounter: '5\' 6"'$  (1.676 m).   Weight as of this encounter:  217 lb (98.4 kg).  BMI interpretation table: BMI level Category Range association with higher incidence of chronic pain  <18 kg/m2 Underweight   18.5-24.9 kg/m2 Ideal body weight   25-29.9 kg/m2 Overweight Increased incidence by 20%  30-34.9 kg/m2 Obese (Class I) Increased incidence by 68%  35-39.9 kg/m2 Severe obesity (Class II) Increased incidence by 136%  >40 kg/m2 Extreme obesity (Class III) Increased incidence by 254%   Patient's current BMI Ideal Body weight  Body mass index is 35.02 kg/m. Ideal body weight: 59.3 kg (130 lb 11.7 oz) Adjusted ideal body weight: 75 kg (165 lb 3.8 oz)   BMI Readings from Last 4 Encounters:  08/31/18 35.02 kg/m  07/10/18 37.12 kg/m  06/05/18 36.32 kg/m  05/22/18 37.12 kg/m   Wt Readings from Last 4 Encounters:  08/31/18 217 lb (98.4 kg)  07/10/18 230 lb (104.3 kg)  06/05/18 225 lb (102.1 kg)  05/22/18 230 lb (104.3 kg)  Psych/Mental status: Alert, oriented x 3 (person, place, & time)       Eyes: PERLA Respiratory: No evidence of acute respiratory distress  Cervical Spine Area Exam  Skin & Axial Inspection: No masses, redness, edema, swelling, or  associated skin lesions Alignment: Symmetrical Functional ROM: Decreased ROM, to the right Stability: No instability detected Muscle Tone/Strength: Functionally intact. No obvious neuro-muscular anomalies detected. Sensory (Neurological): Dermatomal pain pattern right C6, right C7 Palpation: No palpable anomalies              Upper Extremity (UE) Exam    Side: Right upper extremity  Side: Left upper extremity  Skin & Extremity Inspection: Skin color, temperature, and hair growth are WNL. No peripheral edema or cyanosis. No masses, redness, swelling, asymmetry, or associated skin lesions. No contractures.  Skin & Extremity Inspection: Skin color, temperature, and hair growth are WNL. No peripheral edema or cyanosis. No masses, redness, swelling, asymmetry, or associated skin lesions. No contractures.  Functional ROM: Decreased ROM for shoulder  Functional ROM: Unrestricted ROM          Muscle Tone/Strength: Functionally intact. No obvious neuro-muscular anomalies detected.  Muscle Tone/Strength: Functionally intact. No obvious neuro-muscular anomalies detected.  Sensory (Neurological): Dermatomal pain pattern          Sensory (Neurological): Unimpaired          Palpation: No palpable anomalies              Palpation: No palpable anomalies              Provocative Test(s):  Phalen's test: deferred Tinel's test: deferred Apley's scratch test (touch opposite shoulder):  Action 1 (Across chest): Decreased ROM Action 2 (Overhead): Decreased ROM Action 3 (LB reach): Decreased ROM   Provocative Test(s):  Phalen's test: deferred Tinel's test: deferred Apley's scratch test (touch opposite shoulder):  Action 1 (Across chest): deferred Action 2 (Overhead): deferred Action 3 (LB reach): deferred    Thoracic Spine Area Exam  Skin & Axial Inspection: No masses, redness, or swelling Alignment: Symmetrical Functional ROM: Unrestricted ROM Stability: No instability detected Muscle Tone/Strength:  Functionally intact. No obvious neuro-muscular anomalies detected. Sensory (Neurological): Unimpaired Muscle strength & Tone: No palpable anomalies  Lumbar Spine Area Exam  Skin & Axial Inspection: No masses, redness, or swelling Alignment: Symmetrical Functional ROM: Unrestricted ROM       Stability: No instability detected Muscle Tone/Strength: Functionally intact. No obvious neuro-muscular anomalies detected. Sensory (Neurological): Musculoskeletal pain pattern Palpation: Complains of area being tender to palpation  Provocative Tests: Hyperextension/rotation test: (+) bilaterally for facet joint pain. Lumbar quadrant test (Kemp's test): (+) bilaterally for facet joint pain. Lateral bending test: deferred today       Patrick's Maneuver: (+) for bilateral S-I arthralgia             FABER* test: deferred today                   S-I anterior distraction/compression test: deferred today         S-I lateral compression test: deferred today         S-I Thigh-thrust test: deferred today         S-I Gaenslen's test: deferred today         *(Flexion, ABduction and External Rotation)  Gait & Posture Assessment  Ambulation: Unassisted Gait: Relatively normal for age and body habitus Posture: WNL   Lower Extremity Exam    Side: Right lower extremity  Side: Left lower extremity  Stability: No instability observed          Stability: No instability observed          Skin & Extremity Inspection: Skin color, temperature, and hair growth are WNL. No peripheral edema or cyanosis. No masses, redness, swelling, asymmetry, or associated skin lesions. No contractures.  Skin & Extremity Inspection: Skin color, temperature, and hair growth are WNL. No peripheral edema or cyanosis. No masses, redness, swelling, asymmetry, or associated skin lesions. No contractures.  Functional ROM: Unrestricted ROM                  Functional ROM: Unrestricted ROM                  Muscle Tone/Strength:  Functionally intact. No obvious neuro-muscular anomalies detected.  Muscle Tone/Strength: Functionally intact. No obvious neuro-muscular anomalies detected.  Sensory (Neurological): Unimpaired        Sensory (Neurological): Unimpaired        DTR: Patellar: deferred today Achilles: deferred today Plantar: deferred today  DTR: Patellar: deferred today Achilles: deferred today Plantar: deferred today  Palpation: No palpable anomalies  Palpation: No palpable anomalies   Assessment   Status Diagnosis  Persistent Persistent Controlled 1. Cervical radiculopathy   2. Cervicalgia   3. Chronic right-sided low back pain with right-sided sciatica   4. Controlled substance agreement signed   5. Type 2 diabetes mellitus with complication, without long-term current use of insulin (HCC)   6. Cervical spondylosis (R C4,5,6,7)   7. Chronic right SI joint pain   8. Lumbar facet arthropathy   9. Chronic pain syndrome     General Recommendations: The pain condition that the patient suffers from is best treated with a multidisciplinary approach that involves an increase in physical activity to prevent de-conditioning and worsening of the pain cycle, as well as psychological counseling (formal and/or informal) to address the co-morbid psychological affects of pain. Treatment will often involve judicious use of pain medications and interventional procedures to decrease the pain, allowing the patient to participate in the physical activity that will ultimately produce long-lasting pain reductions. The goal of the multidisciplinary approach is to return the patient to a higher level of overall function and to restore their ability to perform activities of daily living.  Patient presents today for medication management.  Is finding benefit with increased dose of Lyrica at 100 mg 3 times a day.  Is pretty distraught that she was unable to get her cervical MRI due to insurance reasons and  out-of-pocket cost being  over $3000.  She occasionally takes an extra hydrocodone on days that she has increased pain.  We discussed increasing her monthly quantity from 30-45.  Discussed opioid tolerance and medication agreement with the patient.  Patient will follow-up in 3 months for medication management.  I instructed the patient to call us if she is able to have her cervical MRI done so we can discussed interventional treatment plan which could possibly include cervical epidural steroid injection.  Plan of Care  Pharmacotherapy (Medications Ordered): Meds ordered this encounter  Medications  . pregabalin (LYRICA) 100 MG capsule    Sig: Take 1 capsule (100 mg total) by mouth 3 (three) times daily.    Dispense:  90 capsule    Refill:  2    Do not place this medication, or any other prescription from our practice, on "Automatic Refill". Patient may have prescription filled one day early if pharmacy is closed on scheduled refill date.  Marland Kitchen HYDROcodone-acetaminophen (NORCO) 10-325 MG tablet    Sig: Take 1-2 tablets by mouth daily as needed for up to 30 days.    Dispense:  45 tablet    Refill:  0  . HYDROcodone-acetaminophen (NORCO) 10-325 MG tablet    Sig: Take 1-2 tablets by mouth daily as needed for up to 30 days for severe pain. Must last 30 days.    Dispense:  45 tablet    Refill:  0    [place] STOP ACT - Not applicable. Fill one day early if pharmacy is closed on scheduled refill date.  Marland Kitchen HYDROcodone-acetaminophen (NORCO) 10-325 MG tablet    Sig: Take 1-2 tablets by mouth daily as needed for up to 30 days for severe pain. Must last 30 days.    Dispense:  45 tablet    Refill:  0     STOP ACT - Not applicable. Fill one day early if pharmacy is closed on scheduled refill date.   Considering:   Cervical epidural steroid injection   PRN Procedures:   To be determined at a later time   Time Note: Greater than 50% of the 25 minute(s) of face-to-face time spent with Ms. Tift, was spent in  counseling/coordination of care regarding: Ms. Lehigh's primary cause of pain, the treatment plan, treatment alternatives, medication side effects, the opioid analgesic risks and possible complications, the appropriate use of her medications, realistic expectations, the goals of pain management (increased in functionality), the medication agreement and the patient's responsibilities when it comes to controlled substances.  Provider-requested follow-up: Return in about 3 months (around 11/29/2018) for Medication Management.  No future appointments.  Primary Care Physician: Donald Prose, MD Location: [facility] Note by: Gillis Santa, M.D Date: 08/31/2018; Time: 2:35 PM  There are no Patient Instructions on file for this visit.

## 2018-08-31 NOTE — Progress Notes (Signed)
Nursing Pain Medication Assessment:  Safety precautions to be maintained throughout the outpatient stay will include: orient to surroundings, keep bed in low position, maintain call bell within reach at all times, provide assistance with transfer out of bed and ambulation.  Medication Inspection Compliance: Pill count conducted under aseptic conditions, in front of the patient. Neither the pills nor the bottle was removed from the patient's sight at any time. Once count was completed pills were immediately returned to the patient in their original bottle.  Medication: Hydrocodone/APAP Pill/Patch Count: 1 of 30 pills remain Pill/Patch Appearance: Markings consistent with prescribed medication Bottle Appearance: Standard pharmacy container. Clearly labeled. Filled Date: 1 / 6 / 2020 Last Medication intake:  Ran out of medicine more than 48 hours ago

## 2018-09-09 IMAGING — DX DG ANKLE COMPLETE 3+V*L*
3 series · 3 of 3 positions shown · non-contrast
Comparison: None.

CLINICAL DATA: Left ankle pain after a fall

EXAM:
LEFT ANKLE COMPLETE - 3+ VIEW

[ankle ap]
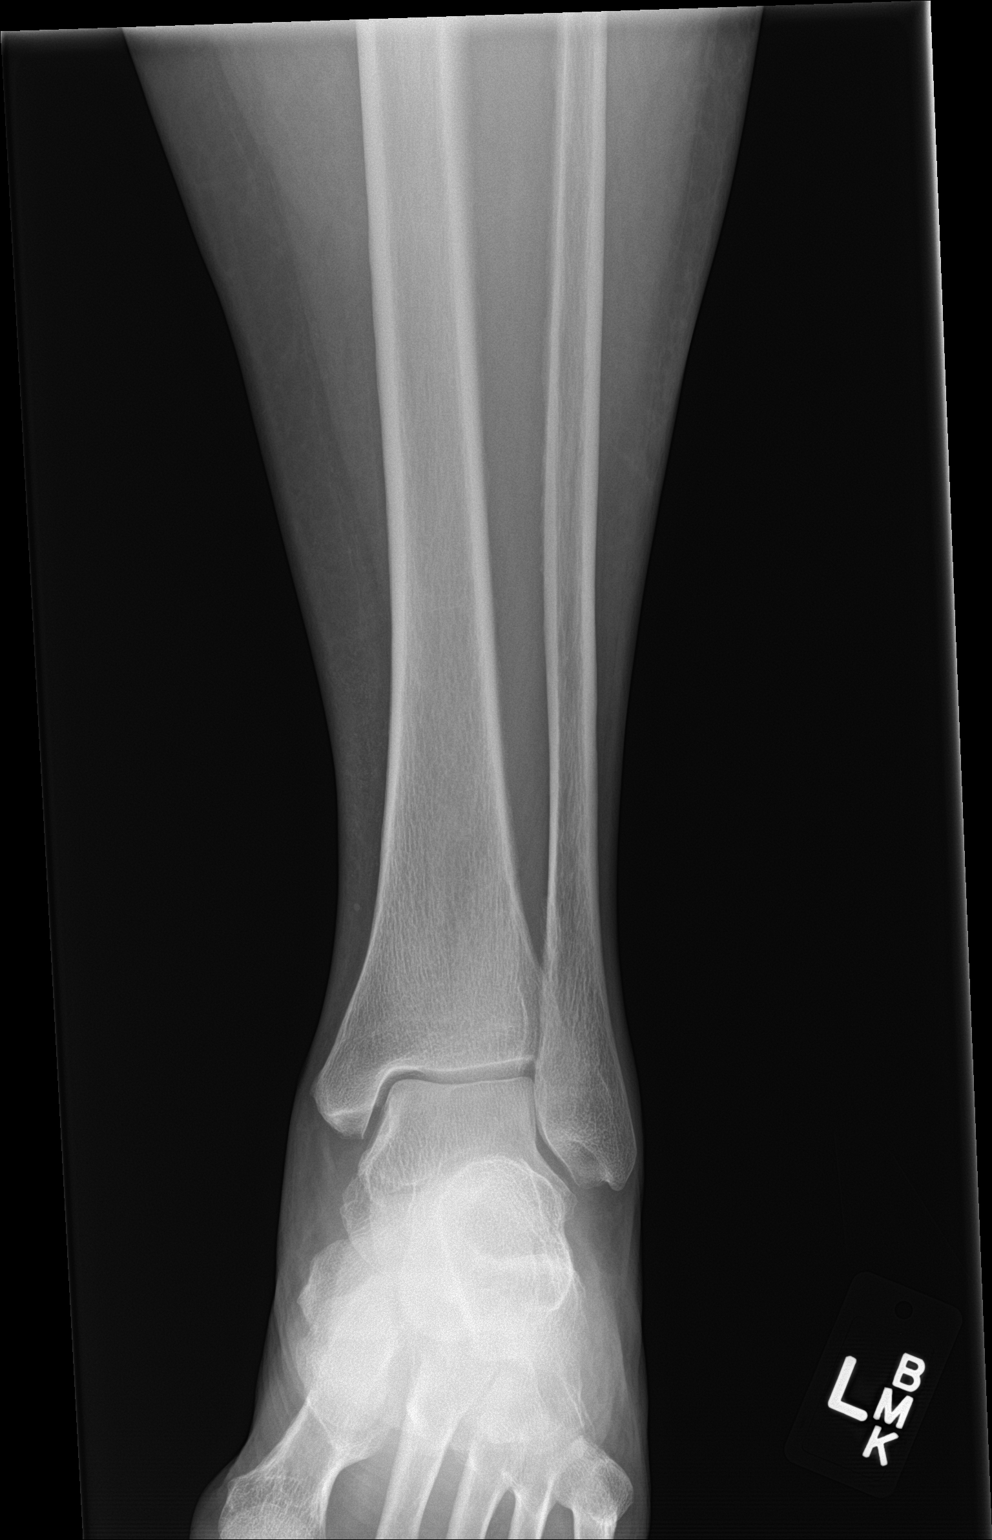

[ankle obl]
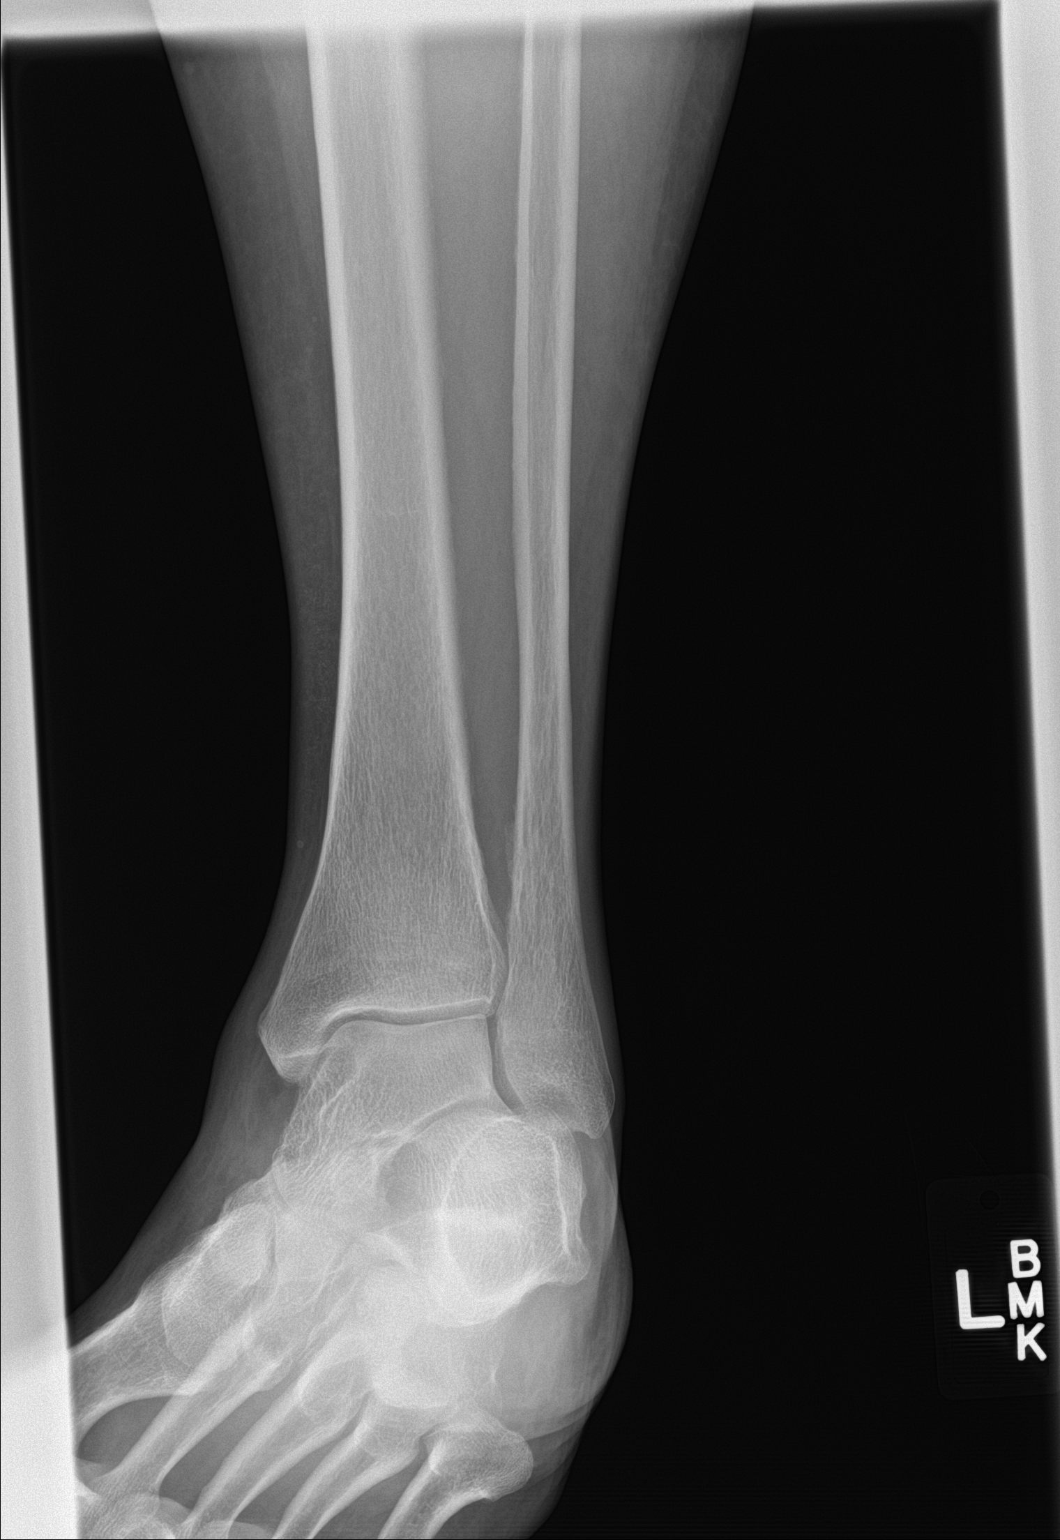

[ankle lat]
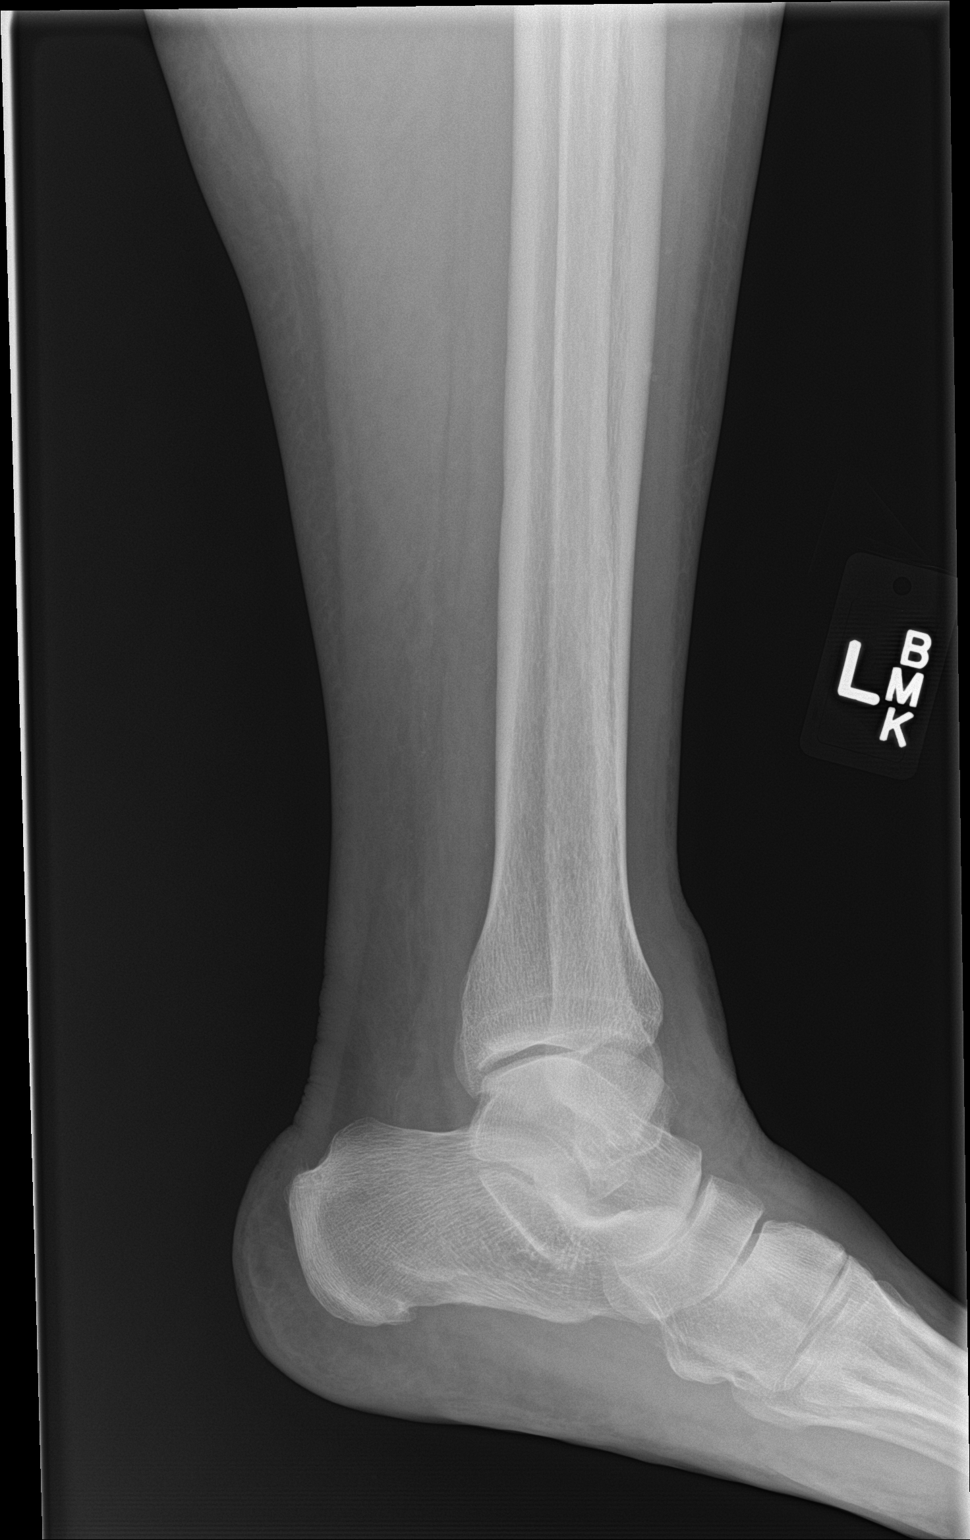

[3 of 3 positions shown; findings below may reference images not displayed]

FINDINGS: No fracture or malalignment. Minimal spurring medially. Small
plantar calcaneal spur
IMPRESSION: 1. No acute osseous abnormality
2. Small plantar calcaneal spur

## 2018-09-13 ENCOUNTER — Other Ambulatory Visit: Payer: Self-pay | Admitting: Student in an Organized Health Care Education/Training Program

## 2018-09-23 IMAGING — DX DG FOOT COMPLETE 3+V*L*
3 series · 3 of 3 positions shown · non-contrast
Comparison: Left ankle radiographs dated 09/24/2016.

CLINICAL DATA: Severe left foot pain in the region of the
metatarsals, greater laterally, since a fall 2 weeks ago.

EXAM:
LEFT FOOT - COMPLETE 3+ VIEW

[foot ap]
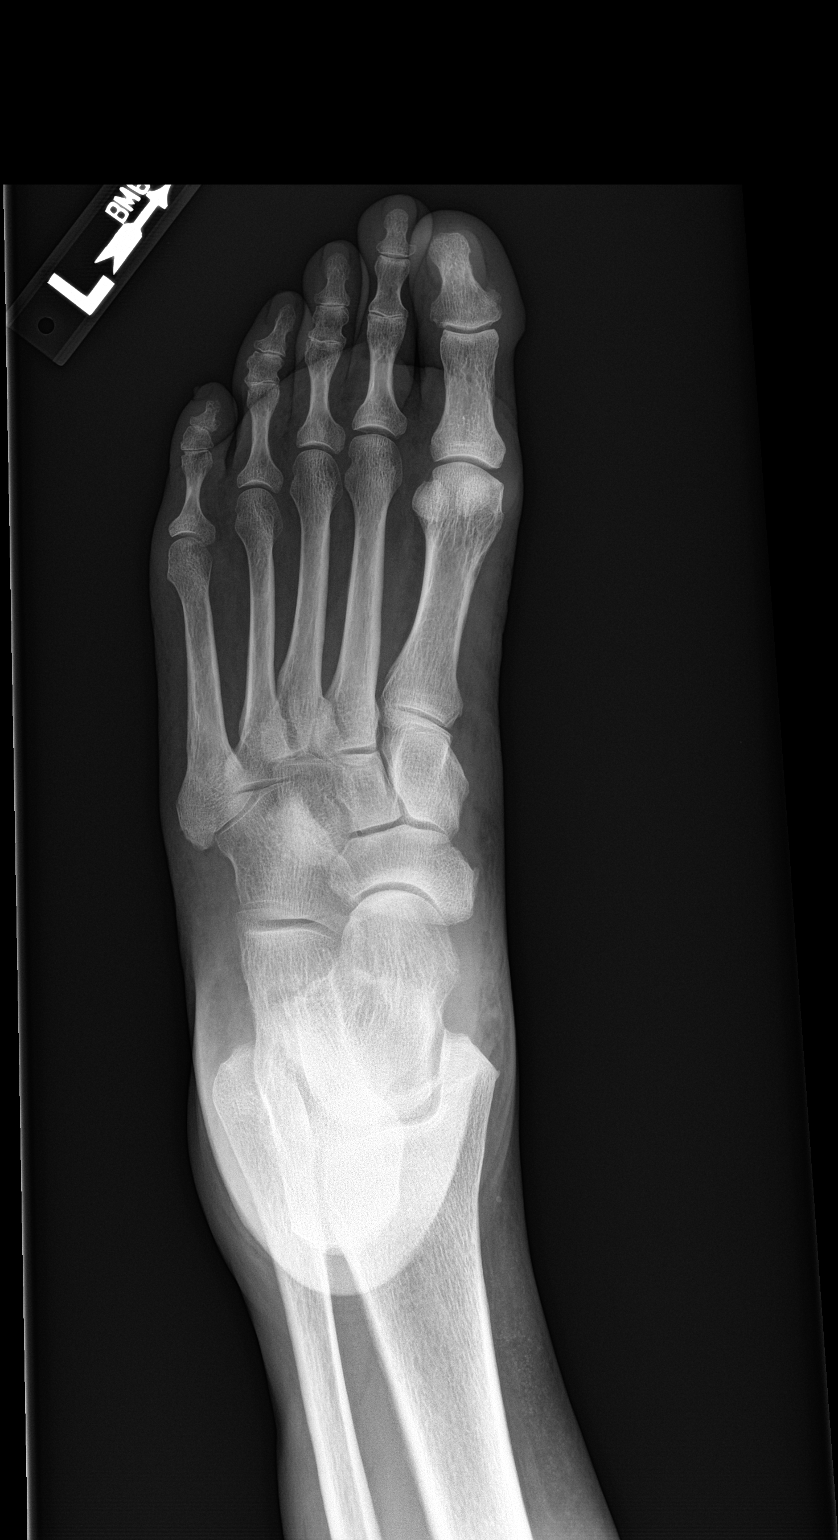

[foot obl]
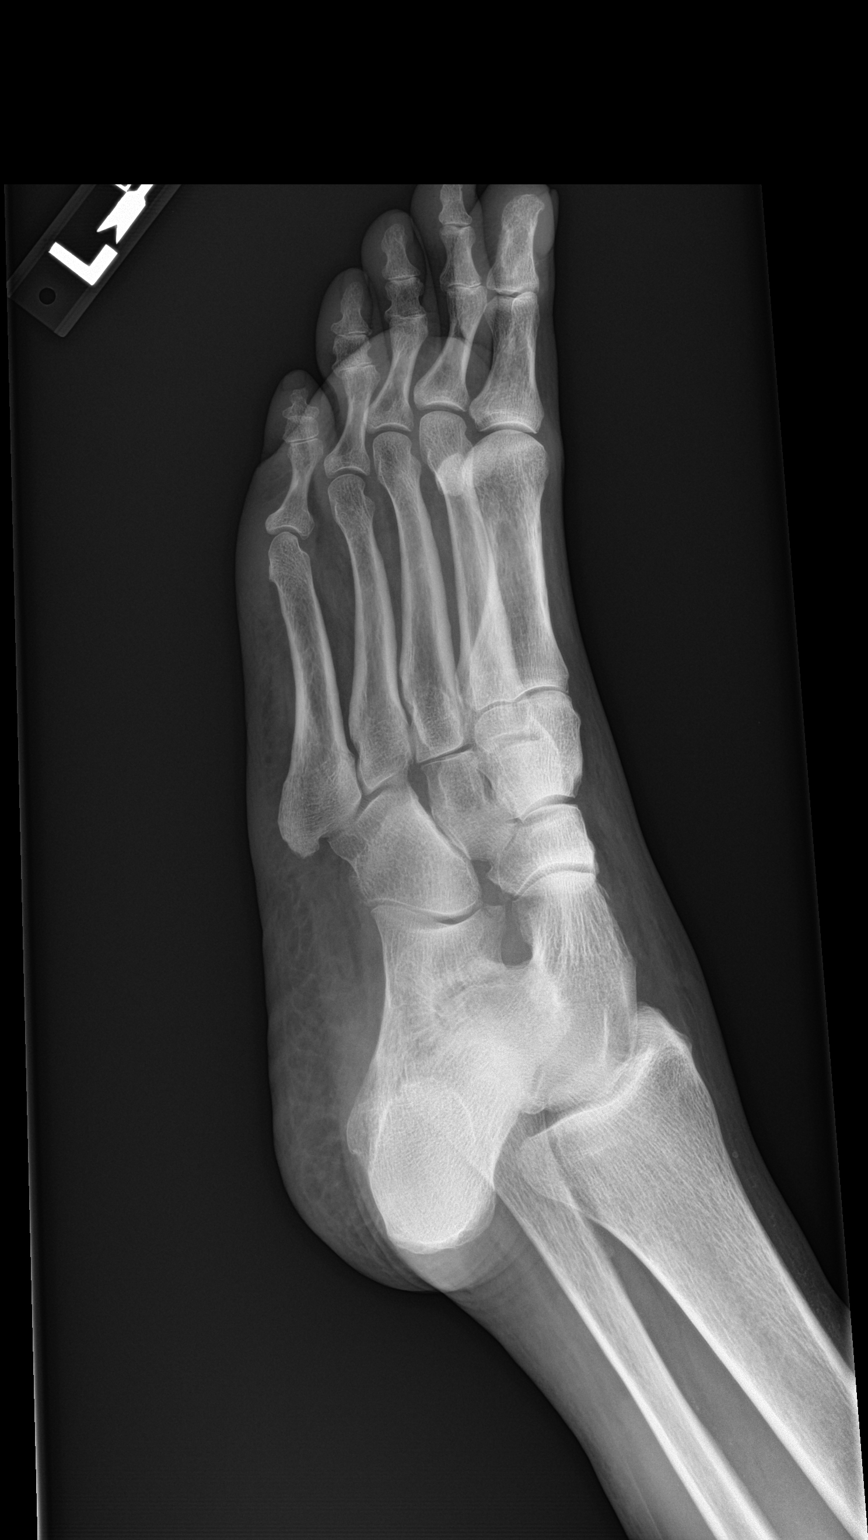

[foot lat]
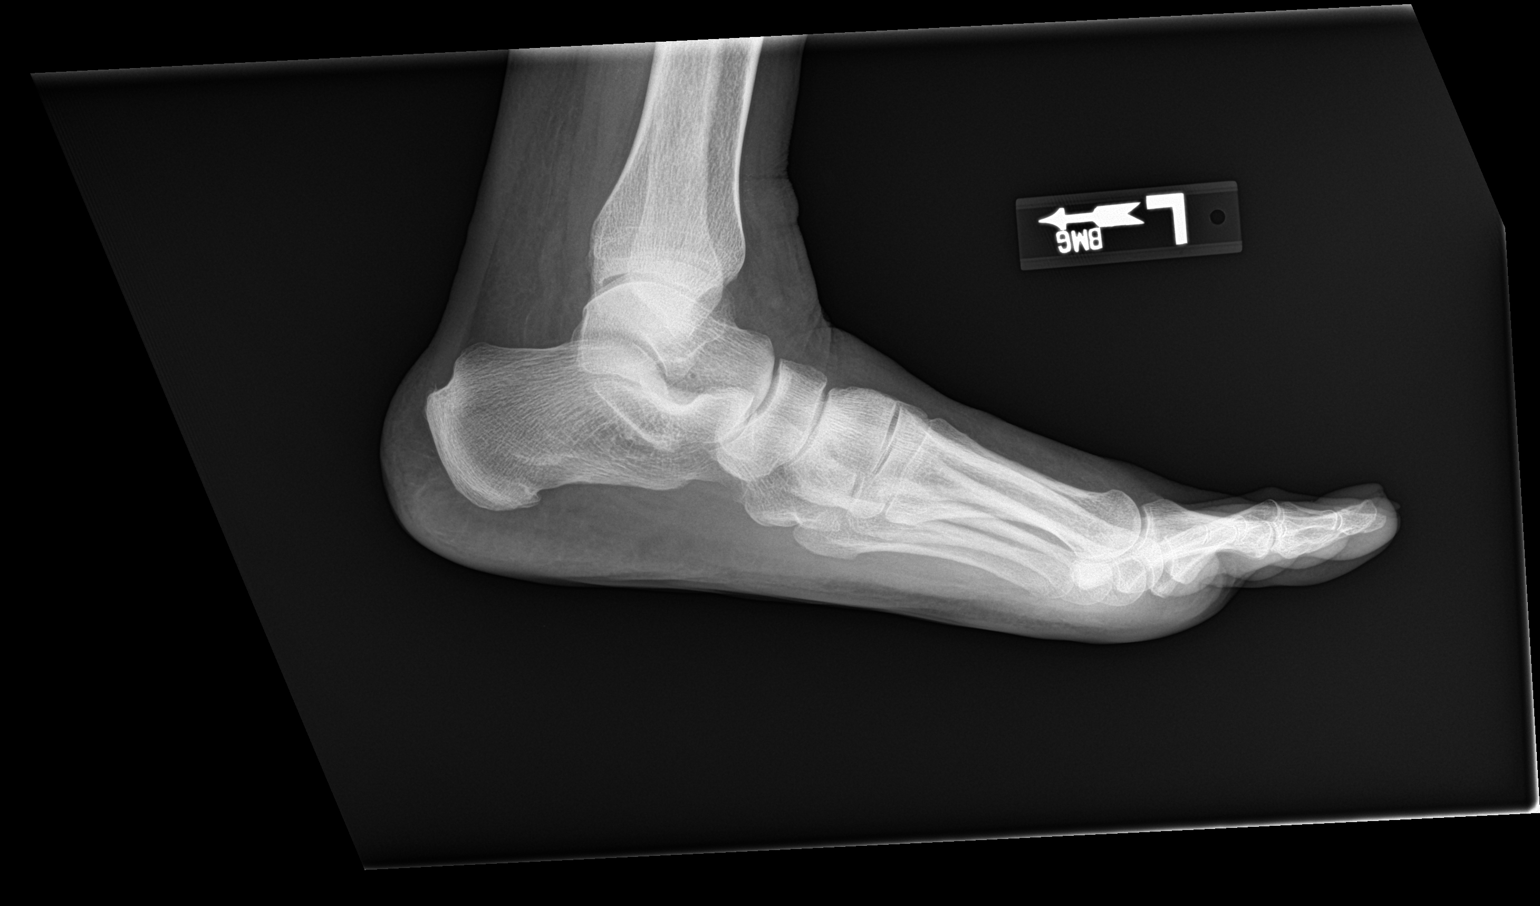

[3 of 3 positions shown; findings below may reference images not displayed]

FINDINGS: Flattening of the normal plantar arch. No fracture or dislocation.
Mid mild inferior and posterior calcaneal spur formation. Mild
posterior talotibial spur formation.
IMPRESSION: 1. No fracture.
2. Pes planus.
3. Mild degenerative changes.

## 2018-11-28 ENCOUNTER — Telehealth: Payer: Self-pay

## 2018-11-28 NOTE — Telephone Encounter (Signed)
Message left on answering service. 

## 2018-11-29 ENCOUNTER — Ambulatory Visit
Payer: Self-pay | Attending: Student in an Organized Health Care Education/Training Program | Admitting: Student in an Organized Health Care Education/Training Program

## 2018-11-29 ENCOUNTER — Other Ambulatory Visit: Payer: Self-pay

## 2018-11-29 ENCOUNTER — Encounter: Payer: Self-pay | Admitting: Student in an Organized Health Care Education/Training Program

## 2018-11-29 DIAGNOSIS — M542 Cervicalgia: Secondary | ICD-10-CM

## 2018-11-29 DIAGNOSIS — E118 Type 2 diabetes mellitus with unspecified complications: Secondary | ICD-10-CM

## 2018-11-29 DIAGNOSIS — M533 Sacrococcygeal disorders, not elsewhere classified: Secondary | ICD-10-CM

## 2018-11-29 DIAGNOSIS — M5412 Radiculopathy, cervical region: Secondary | ICD-10-CM

## 2018-11-29 DIAGNOSIS — M5459 Other low back pain: Secondary | ICD-10-CM

## 2018-11-29 DIAGNOSIS — M47816 Spondylosis without myelopathy or radiculopathy, lumbar region: Secondary | ICD-10-CM

## 2018-11-29 DIAGNOSIS — G8929 Other chronic pain: Secondary | ICD-10-CM

## 2018-11-29 DIAGNOSIS — M47812 Spondylosis without myelopathy or radiculopathy, cervical region: Secondary | ICD-10-CM

## 2018-11-29 DIAGNOSIS — G894 Chronic pain syndrome: Secondary | ICD-10-CM

## 2018-11-29 DIAGNOSIS — M5441 Lumbago with sciatica, right side: Secondary | ICD-10-CM

## 2018-11-29 DIAGNOSIS — M5030 Other cervical disc degeneration, unspecified cervical region: Secondary | ICD-10-CM

## 2018-11-29 DIAGNOSIS — M545 Low back pain: Secondary | ICD-10-CM

## 2018-11-29 MED ORDER — HYDROCODONE-ACETAMINOPHEN 10-325 MG PO TABS: 1.0000 | ORAL_TABLET | Freq: Every day | ORAL | 0 refills | Status: AC | PRN

## 2018-11-29 MED ORDER — DULOXETINE HCL 60 MG PO CPEP: 60.0000 mg | ORAL_CAPSULE | Freq: Every day | ORAL | 2 refills | Status: DC

## 2018-11-29 MED ORDER — PREGABALIN 100 MG PO CAPS: 100.0000 mg | ORAL_CAPSULE | Freq: Three times a day (TID) | ORAL | 2 refills | Status: DC

## 2018-11-29 MED ORDER — MELOXICAM 15 MG PO TABS: 7.5000 mg | ORAL_TABLET | Freq: Every day | ORAL | 2 refills | Status: DC

## 2018-11-29 MED ORDER — TIZANIDINE HCL 2 MG PO TABS: 2.0000 mg | ORAL_TABLET | Freq: Two times a day (BID) | ORAL | 2 refills | Status: DC | PRN

## 2018-11-29 NOTE — Progress Notes (Signed)
Pain Management Virtual Encounter Note - Virtual Visit via Video Conference Telehealth (real-time audio visits between healthcare provider and patient).  Patient's Phone No. & Preferred Pharmacy:  [phone] (home); [phone] (mobile); (Preferred) [phone] [email or web address]  [name] [place] Rough, [place] - [address] AT [facility] OF [address] [place] [address] Phone: [phone] Fax: [phone]   Pre-screening note:  Our staff contacted [name] and offered her an "in person", "face-to-face" appointment versus a telephone encounter. She indicated preferring the telephone encounter, at this time.  Reason for Virtual Visit: COVID-19*  Social distancing based on CDC and AMA recommendations.   I contacted Colleen Andrade on 11/29/2018 at 12:40 PM via video conference.      I clearly identified myself as Edward Jolly, MD. I verified that I was speaking with the correct person using two identifiers (Name and date of birth: Jan 22, 1956).  Advanced Informed Consent I sought verbal advanced consent from Colleen Andrade for virtual visit interactions. I informed Colleen Andrade of possible security and privacy concerns, risks, and limitations associated with providing "not-in-person" medical evaluation and management services. I also informed Colleen Andrade of the availability of "in-person" appointments. Finally, I informed her that there would be a charge for the virtual visit and that she could be  personally, fully or partially, financially responsible for it. Colleen Andrade expressed understanding and agreed to proceed.   Historic Elements   Colleen Andrade is a 63 y.o. year old, female patient evaluated today after her last encounter by our practice on 11/28/2018. Colleen Andrade  has a past medical history of Asthma and Diabetes mellitus without complication (HCC). She also  has a past surgical history that includes  Cholecystectomy. Colleen Andrade has a current medication list which includes the following prescription(s): duloxetine, hydrocodone-acetaminophen, meloxicam, metformin, pregabalin, sitagliptin, tizanidine, hydrocodone-acetaminophen, and hydrocodone-acetaminophen. She  reports that she has been smoking cigarettes. She has never used smokeless tobacco. She reports that she does not drink alcohol or use drugs. Colleen Andrade has No Known Allergies.   HPI  I last communicated with her on 09/13/2018. Today, she is being contacted for medication management.  Overall patient is doing well in regards to her chronic pain.  No changes in her medical history.  Pain at baseline.  States that her baseline pain is slightly improved than before.  Is requesting a refill on Cymbalta, meloxicam, Lyrica, tizanidine, hydrocodone which we will do as below.  Patient to follow back up in 3 months.   Pharmacotherapy Assessment   10/23/2018  2   08/31/2018  Hydrocodone-Acetamin 10-325 MG  45.00 23 Bi Lat   [id number]   Wal ([id number])   0  19.57 MME  Comm Ins   Colleen Andrade  09/23/2018  2   08/31/2018  Pregabalin 100 MG Capsule  90.00 30 Bi Lat   [id number]   Wal ([id number])   0  2.01 LME  Comm Ins   [name]    Monitoring: Pharmacotherapy: No side-effects or adverse reactions reported.  PMP: PDMP reviewed during this encounter.       Compliance: No problems identified. Effectiveness: Clinically acceptable. Plan: Refer to "POC".  Pertinent Labs  Renal Function Lab Results  Component Value Date   BUN 20 01/26/2018   CREATININE 0.75 01/26/2018   GFRAA >60 01/26/2018   GFRNONAA >60 01/26/2018   Hepatic Function Lab Results  Component Value Date   AST 45 (H) 01/26/2018   ALT 53 (H) 01/26/2018  ALBUMIN 4.0 01/26/2018   UDS Summary  Date Value Ref Range Status  03/20/2018 FINAL  Final    Comment:    ==================================================================== TOXASSURE COMP DRUG  ANALYSIS,UR ==================================================================== Test                             Result       Flag       Units Drug Present and Declared for Prescription Verification   Hydrocodone                    1685         EXPECTED   ng/mg creat   Dihydrocodeine                 116          EXPECTED   ng/mg creat   Norhydrocodone                 1169         EXPECTED   ng/mg creat    Sources of hydrocodone include scheduled prescription    medications. Dihydrocodeine and norhydrocodone are expected    metabolites of hydrocodone. Dihydrocodeine is also available as a    scheduled prescription medication.   Pregabalin                     PRESENT      EXPECTED   Tizanidine                     PRESENT      EXPECTED   Duloxetine                     PRESENT      EXPECTED   Acetaminophen                  PRESENT      EXPECTED ==================================================================== Test                      Result    Flag   Units      Ref Range   Creatinine              219              mg/dL      >=04>=20 ==================================================================== Declared Medications:  The flagging and interpretation on this report are based on the  following declared medications.  Unexpected results may arise from  inaccuracies in the declared medications.  **Note: The testing scope of this panel includes these medications:  Duloxetine  Hydrocodone (Hydrocodone-Acetaminophen)  Pregabalin  **Note: The testing scope of this panel does not include small to  moderate amounts of these reported medications:  Acetaminophen (Hydrocodone-Acetaminophen)  Tizanidine  **Note: The testing scope of this panel does not include following  reported medications:  Meloxicam  Metformin  Prednisone  Sitagliptin ==================================================================== For clinical consultation, please call [phone]  [phone]. ====================================================================    Note: Above Lab results reviewed.  Recent imaging  DG C-Arm 1-60 Min-No Report Fluoroscopy was utilized by the requesting physician.  No radiographic  interpretation.   Assessment  The primary encounter diagnosis was Cervical radiculopathy. Diagnoses of Cervicalgia, Chronic right-sided low back pain with right-sided sciatica, Type 2 diabetes mellitus with complication, without long-term current use of insulin (HCC), Cervical spondylosis (R C4,5,6,7), Chronic right SI joint pain, Lumbar facet arthropathy, Chronic pain syndrome,  Lumbar facet joint pain, and Degenerative disc disease, cervical were also pertinent to this visit.  Plan of Care  I have changed Ryenn Groome's meloxicam. I am also having her start on HYDROcodone-acetaminophen and HYDROcodone-acetaminophen. Additionally, I am having her maintain her metFORMIN, sitaGLIPtin, DULoxetine, pregabalin, tiZANidine, and HYDROcodone-acetaminophen.  Pharmacotherapy (Medications Ordered): Meds ordered this encounter  Medications  . DULoxetine (CYMBALTA) 60 MG capsule    Sig: Take 1 capsule (60 mg total) by mouth daily.    Dispense:  30 capsule    Refill:  2  . meloxicam (MOBIC) 15 MG tablet    Sig: Take 0.5 tablets (7.5 mg total) by mouth daily.    Dispense:  30 tablet    Refill:  2  . pregabalin (LYRICA) 100 MG capsule    Sig: Take 1 capsule (100 mg total) by mouth 3 (three) times daily.    Dispense:  90 capsule    Refill:  2    Do not place this medication, or any other prescription from our practice, on "Automatic Refill". Patient may have prescription filled one day early if pharmacy is closed on scheduled refill date.  Marland Kitchen tiZANidine (ZANAFLEX) 2 MG tablet    Sig: Take 1 tablet (2 mg total) by mouth 2 (two) times daily as needed for muscle spasms.    Dispense:  30 tablet    Refill:  2  . HYDROcodone-acetaminophen (NORCO) 10-325 MG tablet    Sig:  Take 1-2 tablets by mouth daily as needed for up to 30 days for severe pain. Must last 30 days.    Dispense:  45 tablet    Refill:  0    [place] STOP ACT - Not applicable. Fill one day early if pharmacy is closed on scheduled refill date.  Marland Kitchen HYDROcodone-acetaminophen (NORCO) 10-325 MG tablet    Sig: Take 1-2 tablets by mouth daily as needed for up to 30 days for severe pain. Must last 30 days.    Dispense:  45 tablet    Refill:  0    [place] STOP ACT - Not applicable. Fill one day early if pharmacy is closed on scheduled refill date.  Marland Kitchen HYDROcodone-acetaminophen (NORCO) 10-325 MG tablet    Sig: Take 1-2 tablets by mouth daily as needed for up to 30 days for severe pain. Must last 30 days.    Dispense:  45 tablet    Refill:  0    [place] STOP ACT - Not applicable. Fill one day early if pharmacy is closed on scheduled refill date.   Orders:  No orders of the defined types were placed in this encounter.  Follow-up plan:   Return in about 3 months (around 03/01/2019) for Medication Management.    I discussed the assessment and treatment plan with the patient. The patient was provided an opportunity to ask questions and all were answered. The patient agreed with the plan and demonstrated an understanding of the instructions.  Patient advised to call back or seek an in-person evaluation if the symptoms or condition worsens.  Total duration of non-face-to-face encounter: 25 minutes.  Note by: Edward Jolly, MD Date: 11/29/2018; Time: 12:40 PM  Note: This dictation was prepared with Dragon dictation. Any transcriptional errors that may result from this process are unintentional.  Disclaimer:  * Given the special circumstances of the COVID-19 pandemic, the federal government has announced that the Office for Civil Rights (OCR) will exercise its enforcement discretion and will not impose penalties on physicians using telehealth in the event of  noncompliance with regulatory requirements under the International Paper and Accountability Act (HIPAA) in connection with the good faith provision of telehealth during the COVID-19 national public health emergency. (AMA)

## 2019-02-20 ENCOUNTER — Other Ambulatory Visit: Payer: Self-pay

## 2019-02-20 DIAGNOSIS — Z20822 Contact with and (suspected) exposure to covid-19: Secondary | ICD-10-CM

## 2019-02-21 ENCOUNTER — Telehealth: Payer: Self-pay

## 2019-02-21 LAB — NOVEL CORONAVIRUS, NAA: SARS-CoV-2, NAA: NOT DETECTED

## 2019-02-21 NOTE — Telephone Encounter (Signed)
Patient returned call requesting COVID19 lab results - DOB/Address verified - Advised of results, no further questions.

## 2019-02-28 ENCOUNTER — Encounter: Payer: Self-pay | Admitting: Student in an Organized Health Care Education/Training Program

## 2019-03-01 ENCOUNTER — Encounter: Payer: Self-pay | Admitting: Student in an Organized Health Care Education/Training Program

## 2019-03-01 ENCOUNTER — Other Ambulatory Visit: Payer: Self-pay

## 2019-03-01 ENCOUNTER — Ambulatory Visit
Payer: BLUE CROSS/BLUE SHIELD | Attending: Student in an Organized Health Care Education/Training Program | Admitting: Student in an Organized Health Care Education/Training Program

## 2019-03-01 VITALS — BP 132/74 | HR 81 | Temp 98.4°F | Resp 16 | Ht 66.0 in | Wt 221.0 lb

## 2019-03-01 DIAGNOSIS — M5030 Other cervical disc degeneration, unspecified cervical region: Secondary | ICD-10-CM

## 2019-03-01 DIAGNOSIS — E118 Type 2 diabetes mellitus with unspecified complications: Secondary | ICD-10-CM | POA: Diagnosis present

## 2019-03-01 DIAGNOSIS — M533 Sacrococcygeal disorders, not elsewhere classified: Secondary | ICD-10-CM

## 2019-03-01 DIAGNOSIS — G894 Chronic pain syndrome: Secondary | ICD-10-CM

## 2019-03-01 DIAGNOSIS — M542 Cervicalgia: Secondary | ICD-10-CM

## 2019-03-01 DIAGNOSIS — M5412 Radiculopathy, cervical region: Secondary | ICD-10-CM

## 2019-03-01 DIAGNOSIS — G8929 Other chronic pain: Secondary | ICD-10-CM

## 2019-03-01 DIAGNOSIS — M47812 Spondylosis without myelopathy or radiculopathy, cervical region: Secondary | ICD-10-CM

## 2019-03-01 DIAGNOSIS — M545 Low back pain: Secondary | ICD-10-CM

## 2019-03-01 DIAGNOSIS — M5441 Lumbago with sciatica, right side: Secondary | ICD-10-CM

## 2019-03-01 DIAGNOSIS — M5459 Other low back pain: Secondary | ICD-10-CM

## 2019-03-01 DIAGNOSIS — M47816 Spondylosis without myelopathy or radiculopathy, lumbar region: Secondary | ICD-10-CM | POA: Diagnosis present

## 2019-03-01 MED ORDER — HYDROCODONE-ACETAMINOPHEN 10-325 MG PO TABS: 1.0000 | ORAL_TABLET | Freq: Every day | ORAL | 0 refills | Status: DC | PRN

## 2019-03-01 MED ORDER — TIZANIDINE HCL 2 MG PO TABS: 2.0000 mg | ORAL_TABLET | Freq: Two times a day (BID) | ORAL | 2 refills | Status: DC | PRN

## 2019-03-01 MED ORDER — HYDROCODONE-ACETAMINOPHEN 10-325 MG PO TABS: 1.0000 | ORAL_TABLET | Freq: Every day | ORAL | 0 refills | Status: AC | PRN

## 2019-03-01 MED ORDER — DULOXETINE HCL 60 MG PO CPEP: 60.0000 mg | ORAL_CAPSULE | Freq: Every day | ORAL | 2 refills | Status: DC

## 2019-03-01 MED ORDER — PREGABALIN 100 MG PO CAPS: 100.0000 mg | ORAL_CAPSULE | Freq: Three times a day (TID) | ORAL | 2 refills | Status: DC

## 2019-03-01 NOTE — Patient Instructions (Signed)
Prescriptions for Hydrocodone, Cymbalta, Lyrica, and Tizanidine were sent to your pharmacy.

## 2019-03-01 NOTE — Progress Notes (Signed)
Patient's Name: Colleen Andrade  MRN: [id number]  Referring Provider: Donald Prose, MD  DOB: 05/07/1956  PCP: Donald Prose, MD  DOS: 03/01/2019  Note by: Gillis Santa, MD  Service setting: Ambulatory outpatient  Attending: Gillis Santa, MD  Location: [facility] (AMB) Pain Management Facility  Specialty: Interventional Pain Management  Patient type: Established   Primary Reason(s) for Visit: Encounter for prescription drug management. (Level of risk: moderate)  CC: Neck Pain (right  )  HPI  Colleen Andrade is a 63 y.o. year old, female patient, who comes today for a medication management evaluation. She has Cervical radiculopathy; Cervicalgia; Degenerative disc disease, cervical; Chronic right-sided low back pain with right-sided sciatica; Chronic right SI joint pain; and Chronic pain syndrome on their problem list. Her primarily concern today is the Neck Pain (right  )  Pain Assessment: Location: Right Neck Radiating: right arm to the hand Onset: More than a month ago Duration: Chronic pain Quality: Dull Severity: 2 /10 (subjective, self-reported pain score)  Note: Reported level is compatible with observation.                         When using our objective Pain Scale, levels between 6 and 10/10 are said to belong in an emergency room, as it progressively worsens from a 6/10, described as severely limiting, requiring emergency care not usually available at an outpatient pain management facility. At a 6/10 level, communication becomes difficult and requires great effort. Assistance to reach the emergency department may be required. Facial flushing and profuse sweating along with potentially dangerous increases in heart rate and blood pressure will be evident. Effect on ADL:   Timing: Intermittent Modifying factors: relaxing, medications BP: 132/74  HR: 81  Colleen Andrade was last scheduled for an appointment on 11/29/2018 for medication management. During today's appointment we reviewed Colleen Andrade's  chronic pain status, as well as her outpatient medication regimen.  No change in medical history since last visit.  Patient's pain is at baseline.  Patient continues multimodal pain regimen as prescribed.  States that it provides pain relief and improvement in functional status.  The patient  reports no history of drug use. Her body mass index is 35.67 kg/m.  Further details on both, my assessment(s), as well as the proposed treatment plan, please see below.  Controlled Substance Pharmacotherapy Assessment REMS (Risk Evaluation and Mitigation Strategy)  Analgesic:  02/14/2019  3   11/29/2018  Hydrocodone-Acetamin 10-325 MG  45.00 22 Bi Lat   [id number]   [facility] ([id number])   0  20.45 MME  Private Pay   [place]  02/14/2019  3   11/29/2018  Pregabalin 100 MG Capsule  90.00 30 Bi Lat   [id number]   [facility] ([id number])   1  2.01 LME  Private Pay   [place]    Landis Martins, [place]  03/01/2019 10:47 AM  Sign when Signing Visit Nursing Pain Medication Assessment:  Safety precautions to be maintained throughout the outpatient stay will include: orient to surroundings, keep bed in low position, maintain call bell within reach at all times, provide assistance with transfer out of bed and ambulation.  Medication Inspection Compliance: Colleen Andrade did not comply with our request to bring her pills to be counted. She was reminded that bringing the medication bottles, even when empty, is a requirement.  Medication: None brought in. Pill/Patch Count: None available to be counted. Bottle Appearance: No container available. Did not bring bottle(s) to appointment. Filled Date: N/A  Last Medication intake:  Today   Pharmacokinetics: Liberation and absorption (onset of action): WNL Distribution (time to peak effect): WNL Metabolism and excretion (duration of action): WNL         Pharmacodynamics: Desired effects: Analgesia: Colleen Andrade reports >50% benefit. Functional ability: Patient reports that medication allows her to accomplish  basic ADLs Clinically meaningful improvement in function (CMIF): Sustained CMIF goals met Perceived effectiveness: Described as relatively effective, allowing for increase in activities of daily living (ADL) Undesirable effects: Side-effects or Adverse reactions: None reported Monitoring: [place] PMP: PDMP reviewed during this encounter. Online review of the past 34-monthperiod conducted. Compliant with practice rules and regulations Last UDS on record: Summary  Date Value Ref Range Status  03/20/2018 FINAL  Final    Comment:    ==================================================================== TOXAS[facility]RE COMP DRUG ANALYSIS,UR ==================================================================== Test                             Result       Flag       Units Drug Present and Declared for Prescription Verification   Hydrocodone                    1685         EXPECTED   ng/mg creat   Dihydrocodeine                 116          EXPECTED   ng/mg creat   Norhydrocodone                 1169         EXPECTED   ng/mg creat    Sources of hydrocodone include scheduled prescription    medications. Dihydrocodeine and norhydrocodone are expected    metabolites of hydrocodone. Dihydrocodeine is also available as a    scheduled prescription medication.   Pregabalin                     PRESENT      EXPECTED   Tizanidine                     PRESENT      EXPECTED   Duloxetine                     PRESENT      EXPECTED   Acetaminophen                  PRESENT      EXPECTED ==================================================================== Test                      Result    Flag   Units      Ref Range   Creatinine              219              mg/dL      >=20 ==================================================================== Declared Medications:  The flagging and interpretation on this report are based on the  following declared medications.  Unexpected results may arise from  inaccuracies in the  declared medications.  **Note: The testing scope of this panel includes these medications:  Duloxetine  Hydrocodone (Hydrocodone-Acetaminophen)  Pregabalin  **Note: The testing scope of this panel does not include small to  moderate amounts of these reported medications:  Acetaminophen (Hydrocodone-Acetaminophen)  Tizanidine  **  Note: The testing scope of this panel does not include following  reported medications:  Meloxicam  Metformin  Prednisone  Sitagliptin ==================================================================== For clinical consultation, please call [phone]. ====================================================================    UDS interpretation: Compliant          Medication Assessment Form: Reviewed. Patient indicates being compliant with therapy Treatment compliance: Compliant Risk Assessment Profile: Aberrant behavior: See initial evaluations. None observed or detected today Comorbid factors increasing risk of overdose: See initial evaluation. No additional risks detected today Opioid risk tool (ORT):  Opioid Risk  08/31/2018  Alcohol 0  Illegal Drugs 0  Rx Drugs 0  Alcohol 0  Illegal Drugs 0  Rx Drugs 0  Age between 16-45 years  0  History of Preadolescent Sexual Abuse 0  Psychological Disease 0  Depression 0  Opioid Risk Tool Scoring 0  Opioid Risk Interpretation Low Risk    ORT Scoring interpretation table:  Score <3 = Low Risk for SUD  Score between 4-7 = Moderate Risk for SUD  Score >8 = High Risk for Opioid Abuse   Risk of substance use disorder (SUD): Low  Risk Mitigation Strategies:  Patient Counseling: Covered Patient-Prescriber Agreement (PPA): Present and active  Notification to other healthcare providers: Done  Pharmacologic Plan: No change in therapy, at this time.             Laboratory Chemistry Profile   Screening Lab Results  Component Value Date   [place] Not Detected 02/20/2019    Renal Lab Results   Component Value Date   BUN 20 01/26/2018   CREATININE 0.75 01/26/2018   GFRAA >60 01/26/2018   GFRNONAA >60 01/26/2018                             Hepatic Lab Results  Component Value Date   AST 45 (H) 01/26/2018   ALT 53 (H) 01/26/2018   ALBUMIN 4.0 01/26/2018   ALKPHOS 67 01/26/2018                        Electrolytes Lab Results  Component Value Date   NA 138 01/26/2018   K 3.8 01/26/2018   CL 107 01/26/2018   CALCIUM 9.0 01/26/2018                        Coagulation Lab Results  Component Value Date   INR 1.04 01/26/2018   LABPROT 13.5 01/26/2018   APTT 31 01/26/2018   PLT 263 01/26/2018                        Cardiovascular Lab Results  Component Value Date   TROPONINI <0.03 01/26/2018   HGB 13.5 01/26/2018   HCT 40.2 01/26/2018                         ID Lab Results  Component Value Date   [place] Not Detected 02/20/2019    Note: Lab results reviewed.    Meds   Current Outpatient Medications:  .  DULoxetine (CYMBALTA) 60 MG capsule, Take 1 capsule (60 mg total) by mouth daily., Disp: 30 capsule, Rfl: 2 .  meloxicam (MOBIC) 15 MG tablet, Take 0.5 tablets (7.5 mg total) by mouth daily., Disp: 30 tablet, Rfl: 2 .  metFORMIN (GLUCOPHAGE) 1000 MG tablet, Take 1,000 mg by mouth 2 (two) times daily with a  meal., Disp: , Rfl:  .  pregabalin (LYRICA) 100 MG capsule, Take 1 capsule (100 mg total) by mouth 3 (three) times daily., Disp: 90 capsule, Rfl: 2 .  tiZANidine (ZANAFLEX) 2 MG tablet, Take 1 tablet (2 mg total) by mouth 2 (two) times daily as needed for muscle spasms., Disp: 30 tablet, Rfl: 2 .  [START ON 03/16/2019] HYDROcodone-acetaminophen (NORCO) 10-325 MG tablet, Take 1-2 tablets by mouth daily as needed for severe pain. Must last 30 days., Disp: 45 tablet, Rfl: 0 .  [START ON 04/15/2019] HYDROcodone-acetaminophen (NORCO) 10-325 MG tablet, Take 1-2 tablets by mouth daily as needed for severe pain. Must last 30 days., Disp: 45 tablet, Rfl: 0 .   [START ON 05/15/2019] HYDROcodone-acetaminophen (NORCO) 10-325 MG tablet, Take 1-2 tablets by mouth daily as needed for severe pain. Must last 30 days., Disp: 45 tablet, Rfl: 0 .  sitaGLIPtin (JANUVIA) 100 MG tablet, Take 100 mg by mouth daily., Disp: , Rfl:   ROS  Constitutional: Denies any fever or chills Gastrointestinal: No reported hemesis, hematochezia, vomiting, or acute GI distress Musculoskeletal: Denies any acute onset joint swelling, redness, loss of ROM, or weakness Neurological: No reported episodes of acute onset apraxia, aphasia, dysarthria, agnosia, amnesia, paralysis, loss of coordination, or loss of consciousness  Allergies  Ms. Dolson has No Known Allergies.  [place]  Drug: Ms. Riden  reports no history of drug use. Alcohol:  reports no history of alcohol use. Tobacco:  reports that she has been smoking cigarettes. She has never used smokeless tobacco. Medical:  has a past medical history of Asthma and Diabetes mellitus without complication ([place]). Surgical: Ms. Kavanaugh  has a past surgical history that includes Cholecystectomy. Family: family history is not on file.  Constitutional Exam  General appearance: Well nourished, well developed, and well hydrated. In no apparent acute distress Vitals:   03/01/19 1042  BP: 132/74  Pulse: 81  Resp: 16  Temp: 98.4 F (36.9 C)  TempSrc: Oral  SpO2: 100%  Weight: 221 lb (100.2 kg)  Height: '5\' 6"'$  (1.676 m)   BMI Assessment: Estimated body mass index is 35.67 kg/m as calculated from the following:   Height as of this encounter: '5\' 6"'$  (1.676 m).   Weight as of this encounter: 221 lb (100.2 kg).  BMI interpretation table: BMI level Category Range association with higher incidence of chronic pain  <18 kg/m2 Underweight   18.5-24.9 kg/m2 Ideal body weight   25-29.9 kg/m2 Overweight Increased incidence by 20%  30-34.9 kg/m2 Obese (Class I) Increased incidence by 68%  35-39.9 kg/m2 Severe obesity (Class II) Increased  incidence by 136%  >40 kg/m2 Extreme obesity (Class III) Increased incidence by 254%   Patient's current BMI Ideal Body weight  Body mass index is 35.67 kg/m. Ideal body weight: 59.3 kg (130 lb 11.7 oz) Adjusted ideal body weight: 75.7 kg (166 lb 13.4 oz)   BMI Readings from Last 4 Encounters:  03/01/19 35.67 kg/m  08/31/18 35.02 kg/m  07/10/18 37.12 kg/m  06/05/18 36.32 kg/m   Wt Readings from Last 4 Encounters:  03/01/19 221 lb (100.2 kg)  08/31/18 217 lb (98.4 kg)  07/10/18 230 lb (104.3 kg)  06/05/18 225 lb (102.1 kg)  Psych/Mental status: Alert, oriented x 3 (person, place, & time)       Eyes: PERLA Respiratory: No evidence of acute respiratory distress  Cervical Spine Area Exam  Skin & Axial Inspection: No masses, redness, edema, swelling, or associated skin lesions Alignment: Symmetrical Functional ROM: Decreased ROM,  to the right Stability: No instability detected Muscle Tone/Strength: Functionally intact. No obvious neuro-muscular anomalies detected. Sensory (Neurological): Dermatomal pain pattern right c6/7 Palpation: Complains of area being tender to palpation              Upper Extremity (UE) Exam    Side: Right upper extremity  Side: Left upper extremity  Skin & Extremity Inspection: Skin color, temperature, and hair growth are WNL. No peripheral edema or cyanosis. No masses, redness, swelling, asymmetry, or associated skin lesions. No contractures.  Skin & Extremity Inspection: Skin color, temperature, and hair growth are WNL. No peripheral edema or cyanosis. No masses, redness, swelling, asymmetry, or associated skin lesions. No contractures.  Functional ROM: Decreased ROM for shoulder  Functional ROM: Unrestricted ROM          Muscle Tone/Strength: Functionally intact. No obvious neuro-muscular anomalies detected.  Muscle Tone/Strength: Functionally intact. No obvious neuro-muscular anomalies detected.  Sensory (Neurological): Dermatomal pain pattern           Sensory (Neurological): Unimpaired          Palpation: No palpable anomalies              Palpation: No palpable anomalies              Provocative Test(s):  Phalen's test: deferred Tinel's test: deferred Apley's scratch test (touch opposite shoulder):  Action 1 (Across chest): Decreased ROM Action 2 (Overhead): Decreased ROM Action 3 (LB reach): Decreased ROM   Provocative Test(s):  Phalen's test: deferred Tinel's test: deferred Apley's scratch test (touch opposite shoulder):  Action 1 (Across chest): deferred Action 2 (Overhead): deferred Action 3 (LB reach): deferred    Thoracic Spine Area Exam  Skin & Axial Inspection: No masses, redness, or swelling Alignment: Symmetrical Functional ROM: Unrestricted ROM Stability: No instability detected Muscle Tone/Strength: Functionally intact. No obvious neuro-muscular anomalies detected. Sensory (Neurological): Unimpaired Muscle strength & Tone: No palpable anomalies  Lumbar Spine Area Exam  Skin & Axial Inspection: No masses, redness, or swelling Alignment: Symmetrical Functional ROM: Unrestricted ROM       Stability: No instability detected Muscle Tone/Strength: Functionally intact. No obvious neuro-muscular anomalies detected. Sensory (Neurological): Unimpaired Palpation: No palpable anomalies       Provocative Tests: Hyperextension/rotation test: deferred today       Lumbar quadrant test (Kemp's test): deferred today       Lateral bending test: deferred today       Patrick's Maneuver: deferred today                   FABER* test: deferred today                   S-I anterior distraction/compression test: deferred today         S-I lateral compression test: deferred today         S-I Thigh-thrust test: deferred today         S-I Gaenslen's test: deferred today         *(Flexion, ABduction and External Rotation)  Gait & Posture Assessment  Ambulation: Unassisted Gait: Relatively normal for age and body habitus Posture:  WNL   Lower Extremity Exam    Side: Right lower extremity  Side: Left lower extremity  Stability: No instability observed          Stability: No instability observed          Skin & Extremity Inspection: Skin color, temperature, and hair growth are WNL. No peripheral  edema or cyanosis. No masses, redness, swelling, asymmetry, or associated skin lesions. No contractures.  Skin & Extremity Inspection: Skin color, temperature, and hair growth are WNL. No peripheral edema or cyanosis. No masses, redness, swelling, asymmetry, or associated skin lesions. No contractures.  Functional ROM: Unrestricted ROM                  Functional ROM: Unrestricted ROM                  Muscle Tone/Strength: Functionally intact. No obvious neuro-muscular anomalies detected.  Muscle Tone/Strength: Functionally intact. No obvious neuro-muscular anomalies detected.  Sensory (Neurological): Unimpaired        Sensory (Neurological): Unimpaired        DTR: Patellar: deferred today Achilles: deferred today Plantar: deferred today  DTR: Patellar: deferred today Achilles: deferred today Plantar: deferred today  Palpation: No palpable anomalies  Palpation: No palpable anomalies   Assessment   Status Diagnosis  Controlled Controlled Controlled 1. Cervical radiculopathy   2. Cervicalgia   3. Chronic right-sided low back pain with right-sided sciatica   4. Type 2 diabetes mellitus with complication, without long-term current use of insulin (HCC)   5. Cervical spondylosis (R C4,5,6,7)   6. Chronic right SI joint pain   7. Lumbar facet arthropathy   8. Chronic pain syndrome   9. Lumbar facet joint pain   10. Degenerative disc disease, cervical      Plan of Care  Pharmacotherapy (Medications Ordered): Meds ordered this encounter  Medications  . DULoxetine (CYMBALTA) 60 MG capsule    Sig: Take 1 capsule (60 mg total) by mouth daily.    Dispense:  30 capsule    Refill:  2  . pregabalin (LYRICA) 100 MG capsule     Sig: Take 1 capsule (100 mg total) by mouth 3 (three) times daily.    Dispense:  90 capsule    Refill:  2    Do not place this medication, or any other prescription from our practice, on "Automatic Refill". Patient may have prescription filled one day early if pharmacy is closed on scheduled refill date.  Marland Kitchen tiZANidine (ZANAFLEX) 2 MG tablet    Sig: Take 1 tablet (2 mg total) by mouth 2 (two) times daily as needed for muscle spasms.    Dispense:  30 tablet    Refill:  2  . HYDROcodone-acetaminophen (NORCO) 10-325 MG tablet    Sig: Take 1-2 tablets by mouth daily as needed for severe pain. Must last 30 days.    Dispense:  45 tablet    Refill:  0    Chronic Pain. (STOP Act - Not applicable). Fill one day early if closed on scheduled refill date.  Marland Kitchen HYDROcodone-acetaminophen (NORCO) 10-325 MG tablet    Sig: Take 1-2 tablets by mouth daily as needed for severe pain. Must last 30 days.    Dispense:  45 tablet    Refill:  0    Chronic Pain. (STOP Act - Not applicable). Fill one day early if closed on scheduled refill date.  Marland Kitchen HYDROcodone-acetaminophen (NORCO) 10-325 MG tablet    Sig: Take 1-2 tablets by mouth daily as needed for severe pain. Must last 30 days.    Dispense:  45 tablet    Refill:  0    Chronic Pain. (STOP Act - Not applicable). Fill one day early if closed on scheduled refill date.   Orders:  Orders Placed This Encounter  Procedures  . ToxASSURE Select 13 (MW), Urine  Volume: 30 ml(s). Minimum 3 ml of urine is needed. Document temperature of fresh sample. Indications: Long term (current) use of opiate analgesic (Z79.891)    Lab Orders     ToxASSURE Select 13 (MW), Urine  Return in about 3 months (around 06/01/2019) for Medication Management, virtual.   Recent Visits No visits were found meeting these conditions.  Showing recent visits within past 90 days and meeting all other requirements   Today's Visits Date Type Provider Dept  03/01/19 Office Visit Gillis Santa, MD [facility]  Showing today's visits and meeting all other requirements   Future Appointments Date Type Provider Dept  05/24/19 Appointment Gillis Santa, MD [facility]  Showing future appointments within next 90 days and meeting all other requirements   Primary Care Physician: Donald Prose, MD Location: [facility] Note by: Gillis Santa, MD Date: 03/01/2019; Time: 11:43 AM  Note: This dictation was prepared with Dragon dictation. Any transcriptional errors that may result from this process are unintentional.

## 2019-03-01 NOTE — Progress Notes (Signed)
Nursing Pain Medication Assessment:  Safety precautions to be maintained throughout the outpatient stay will include: orient to surroundings, keep bed in low position, maintain call bell within reach at all times, provide assistance with transfer out of bed and ambulation.  Medication Inspection Compliance: Colleen Andrade did not comply with our request to bring her pills to be counted. She was reminded that bringing the medication bottles, even when empty, is a requirement.  Medication: None brought in. Pill/Patch Count: None available to be counted. Bottle Appearance: No container available. Did not bring bottle(s) to appointment. Filled Date: N/A Last Medication intake:  Today

## 2019-03-06 LAB — TOXASSURE SELECT 13 (MW), URINE

## 2019-05-21 ENCOUNTER — Other Ambulatory Visit: Payer: Self-pay | Admitting: Student in an Organized Health Care Education/Training Program

## 2019-05-23 ENCOUNTER — Encounter: Payer: Self-pay | Admitting: Student in an Organized Health Care Education/Training Program

## 2019-05-24 ENCOUNTER — Other Ambulatory Visit: Payer: Self-pay

## 2019-05-24 ENCOUNTER — Ambulatory Visit
Payer: BLUE CROSS/BLUE SHIELD | Attending: Student in an Organized Health Care Education/Training Program | Admitting: Student in an Organized Health Care Education/Training Program

## 2019-05-24 ENCOUNTER — Encounter: Payer: Self-pay | Admitting: Student in an Organized Health Care Education/Training Program

## 2019-05-24 DIAGNOSIS — M542 Cervicalgia: Secondary | ICD-10-CM

## 2019-05-24 DIAGNOSIS — E118 Type 2 diabetes mellitus with unspecified complications: Secondary | ICD-10-CM

## 2019-05-24 DIAGNOSIS — M47816 Spondylosis without myelopathy or radiculopathy, lumbar region: Secondary | ICD-10-CM

## 2019-05-24 DIAGNOSIS — M5459 Other low back pain: Secondary | ICD-10-CM

## 2019-05-24 DIAGNOSIS — M545 Low back pain: Secondary | ICD-10-CM

## 2019-05-24 DIAGNOSIS — M47812 Spondylosis without myelopathy or radiculopathy, cervical region: Secondary | ICD-10-CM

## 2019-05-24 DIAGNOSIS — M5441 Lumbago with sciatica, right side: Secondary | ICD-10-CM | POA: Diagnosis not present

## 2019-05-24 DIAGNOSIS — G8929 Other chronic pain: Secondary | ICD-10-CM

## 2019-05-24 DIAGNOSIS — M5412 Radiculopathy, cervical region: Secondary | ICD-10-CM | POA: Diagnosis not present

## 2019-05-24 DIAGNOSIS — G894 Chronic pain syndrome: Secondary | ICD-10-CM

## 2019-05-24 DIAGNOSIS — M5030 Other cervical disc degeneration, unspecified cervical region: Secondary | ICD-10-CM | POA: Insufficient documentation

## 2019-05-24 MED ORDER — PREGABALIN 100 MG PO CAPS: 100.0000 mg | ORAL_CAPSULE | Freq: Three times a day (TID) | ORAL | 5 refills | Status: DC

## 2019-05-24 MED ORDER — HYDROCODONE-ACETAMINOPHEN 10-325 MG PO TABS: 1.0000 | ORAL_TABLET | Freq: Every day | ORAL | 0 refills | Status: AC | PRN

## 2019-05-24 MED ORDER — DULOXETINE HCL 60 MG PO CPEP: 60.0000 mg | ORAL_CAPSULE | Freq: Every day | ORAL | 5 refills | Status: DC

## 2019-05-24 MED ORDER — TIZANIDINE HCL 2 MG PO TABS: 2.0000 mg | ORAL_TABLET | Freq: Two times a day (BID) | ORAL | 5 refills | Status: DC | PRN

## 2019-05-24 NOTE — Progress Notes (Signed)
Pain Management Virtual Encounter Note - Virtual Visit via Video Conference Telehealth (real-time audio visits between healthcare provider and patient).   Patient's Phone No. & Preferred Pharmacy:  [phone] (home); [phone] (mobile); (Preferred) [phone] [email or web address]  [id number] Nicholes Rough- , [place] - [address] [place] [address] Phone: [phone] Fax: [phone]    Pre-screening note:  Our staff contacted Colleen Andrade and offered her an "in person", "face-to-face" appointment versus a telephone encounter. She indicated preferring the telephone encounter, at this time.   Reason for Virtual Visit: COVID-19*  Social distancing based on CDC and AMA recommendations.   I contacted Colleen Andrade Nohr on 05/24/2019 via video conference.      I clearly identified myself as Edward JollyBilal Edison Wollschlager, MD. I verified that I was speaking with the correct person using two identifiers (Name: Colleen Andrade Henault, and date of birth: 12/13/1955).  Advanced Informed Consent I sought verbal advanced consent from Colleen Askewiana Sharrow for virtual visit interactions. I informed Colleen Andrade of possible security and privacy concerns, risks, and limitations associated with providing "not-in-person" medical evaluation and management services. I also informed Colleen Andrade of the availability of "in-person" appointments. Finally, I informed her that there would be a charge for the virtual visit and that she could be  personally, fully or partially, financially responsible for it. [name] expressed understanding and agreed to proceed.   Historic Elements   [name] Czaja is a 63 y.o. year old, female patient evaluated today after her last encounter by our practice on 05/21/2019. [name]  has a past medical history of Asthma and Diabetes mellitus without complication (HCC). She also  has a past surgical history that includes  Cholecystectomy. [name] has a current medication list which includes the following prescription(s): duloxetine, hydrocodone-acetaminophen, hydrocodone-acetaminophen, hydrocodone-acetaminophen, meloxicam, metformin, pregabalin, tizanidine, glipizide, lisinopril, and sitagliptin. She  reports that she has been smoking cigarettes. She has never used smokeless tobacco. She reports that she does not drink alcohol or use drugs. [name] has No Known Allergies.   HPI  Today, she is being contacted for medication management.   No change in medical history since last visit.  Patient's pain is at baseline.  Patient continues multimodal pain regimen as prescribed.  States that it provides pain relief and improvement in functional status.   Pharmacotherapy Assessment  Analgesic: 05/20/2019 3 03/01/2019  Hydrocodone-Acetamin 10-325 MG  45.00 22 [address] [address]" data-toggle="tooltip" data-html="true" Bi Lat [id number] [address] [facility] [address] [id number]" Wal [phone](7587) 0 20.45 MME Private Pay [place] 03/01/2019 3 03/01/2019  Pregabalin 100 MG Capsule  90.00 30 [address] [address]" Bi Lat [id number] [address] [place] [address]" Wal (7587) 0 2.01 LME Comm Ins [place]   Monitoring: Pharmacotherapy: No side-effects or adverse reactions reported. [place] PMP: PDMP reviewed during this encounter.       Compliance: No problems identified. Effectiveness: Clinically acceptable. Plan: Refer to "POC".  UDS:  Summary  Date Value Ref Range Status  03/01/2019 Comment  Final    Comment:    Note ==================================================================== ToxASSURE Select 13 (MW) ==================================================================== Test                             Result       Flag       Units Drug Present and Declared for Prescription Verification  Hydrocodone                    184          EXPECTED   ng/mg creat    Dihydrocodeine                 76           EXPECTED   ng/mg creat   Norhydrocodone                 240          EXPECTED   ng/mg creat    Sources of hydrocodone include scheduled prescription medications.    Dihydrocodeine and norhydrocodone are expected metabolites of    hydrocodone. Dihydrocodeine is also available as a scheduled    prescription medication. ==================================================================== Test                      Result    Flag   Units      Ref Range   Creatinine              124              mg/dL      >=62 ==================================================================== Declared Medications:  The flagging and interpretation on this report are based on the  following declared medications.  Unexpected results may arise from  inaccuracies in the declared medications.  **Note: The testing scope of this panel includes these medications:  Hydrocodone (Norco)  **Note: The testing scope of this panel does not include the  following reported medications:  Acetaminophen (Norco)  Duloxetine  Meloxicam (Mobic)  Metformin (Glucophage)  Pregabalin (Lyrica)  Sitagliptin (Januvia)  Tizanidine (Zanaflex) ==================================================================== For clinical consultation, please call [phone]. ====================================================================    Laboratory Chemistry Profile (12 mo)  Renal: No results found for requested labs within last 8760 hours.  Lab Results  Component Value Date   GFRAA >60 01/26/2018   GFRNONAA >60 01/26/2018   Hepatic: No results found for requested labs within last 8760 hours. Lab Results  Component Value Date   AST 45 (H) 01/26/2018   ALT 53 (H) 01/26/2018   Other: No results found for requested labs within last 8760 hours. Note: Above Lab results reviewed.   Assessment  The primary encounter diagnosis was Cervical radiculopathy. Diagnoses of Cervicalgia, Chronic  right-sided low back pain with right-sided sciatica, Type 2 diabetes mellitus with complication, without long-term current use of insulin (HCC), Cervical spondylosis (R C4,5,6,7), Lumbar facet joint pain, Lumbar facet arthropathy, and Chronic pain syndrome were also pertinent to this visit.  Plan of Care  I am having Santosha Jividen start on HYDROcodone-acetaminophen and HYDROcodone-acetaminophen. I am also having her maintain her metFORMIN, sitaGLIPtin, meloxicam, glipiZIDE, lisinopril, HYDROcodone-acetaminophen, DULoxetine, pregabalin, and tiZANidine.  General Recommendations: The pain condition that the patient suffers from is best treated with a multidisciplinary approach that involves an increase in physical activity to prevent de-conditioning and worsening of the pain cycle, as well as psychological counseling (formal and/or informal) to address the co-morbid psychological affects of pain. Treatment will often involve judicious use of pain medications and interventional procedures to decrease the pain, allowing the patient to participate in the physical activity that will ultimately produce long-lasting pain reductions. The goal of the multidisciplinary approach is to return the patient to a higher level of overall function and to restore their ability to perform activities of daily living.   Pharmacotherapy (Medications Ordered): Meds ordered this  encounter  Medications  . HYDROcodone-acetaminophen (NORCO) 10-325 MG tablet    Sig: Take 1-2 tablets by mouth daily as needed for severe pain. Must last 30 days.    Dispense:  45 tablet    Refill:  0    Chronic Pain. (STOP Act - Not applicable). Fill one day early if closed on scheduled refill date.  . DULoxetine (CYMBALTA) 60 MG capsule    Sig: Take 1 capsule (60 mg total) by mouth daily.    Dispense:  30 capsule    Refill:  5  . pregabalin (LYRICA) 100 MG capsule    Sig: Take 1 capsule (100 mg total) by mouth 3 (three) times daily.     Dispense:  90 capsule    Refill:  5    Do not place this medication, or any other prescription from our practice, on "Automatic Refill". Patient may have prescription filled one day early if pharmacy is closed on scheduled refill date.  Marland Kitchen tiZANidine (ZANAFLEX) 2 MG tablet    Sig: Take 1 tablet (2 mg total) by mouth 2 (two) times daily as needed for muscle spasms.    Dispense:  30 tablet    Refill:  5  . HYDROcodone-acetaminophen (NORCO) 10-325 MG tablet    Sig: Take 1-2 tablets by mouth daily as needed for severe pain. Must last 30 days.    Dispense:  45 tablet    Refill:  0    Chronic Pain. (STOP Act - Not applicable). Fill one day early if closed on scheduled refill date.  Marland Kitchen HYDROcodone-acetaminophen (NORCO) 10-325 MG tablet    Sig: Take 1-2 tablets by mouth daily as needed for severe pain. Must last 30 days.    Dispense:  45 tablet    Refill:  0    Chronic Pain. (STOP Act - Not applicable). Fill one day early if closed on scheduled refill date.   Follow-up plan:   No follow-ups on file.   Recent Visits Date Type Provider Dept  03/01/19 Office Visit Gillis Santa, MD [facility]  Showing recent visits within past 90 days and meeting all other requirements   Today's Visits Date Type Provider Dept  05/24/19 Office Visit Gillis Santa, MD [facility]  Showing today's visits and meeting all other requirements   Future Appointments No visits were found meeting these conditions.  Showing future appointments within next 90 days and meeting all other requirements   I discussed the assessment and treatment plan with the patient. The patient was provided an opportunity to ask questions and all were answered. The patient agreed with the plan and demonstrated an understanding of the instructions.  Patient advised to call back or seek an in-person evaluation if the symptoms or condition worsens.  Total duration of non-face-to-face encounter: 67minutes.  Note by: Gillis Santa, MD Date: 05/24/2019; Time: 8:50 AM  Note: This dictation was prepared with Dragon dictation. Any transcriptional errors that may result from this process are unintentional.  Disclaimer:  * Given the special circumstances of the COVID-19 pandemic, the federal government has announced that the Office for Civil Rights (OCR) will exercise its enforcement discretion and will not impose penalties on physicians using telehealth in the event of noncompliance with regulatory requirements under the [place] (HIPAA) in connection with the good faith provision of telehealth during the HALPF-79 national public health emergency. ([place])

## 2019-08-29 ENCOUNTER — Telehealth: Payer: Self-pay | Admitting: *Deleted

## 2019-08-29 NOTE — Telephone Encounter (Signed)
2nd attempt to reach patient re; appt on 08/30/19

## 2019-08-29 NOTE — Telephone Encounter (Signed)
Voicemail left to please call re; appt for tomorrow 08/30/19.

## 2019-08-30 ENCOUNTER — Encounter: Payer: Self-pay | Admitting: Student in an Organized Health Care Education/Training Program

## 2019-08-30 ENCOUNTER — Ambulatory Visit
Payer: Self-pay | Attending: Student in an Organized Health Care Education/Training Program | Admitting: Student in an Organized Health Care Education/Training Program

## 2019-08-30 ENCOUNTER — Other Ambulatory Visit: Payer: Self-pay

## 2019-08-30 DIAGNOSIS — M5030 Other cervical disc degeneration, unspecified cervical region: Secondary | ICD-10-CM

## 2019-08-30 DIAGNOSIS — M47812 Spondylosis without myelopathy or radiculopathy, cervical region: Secondary | ICD-10-CM

## 2019-08-30 DIAGNOSIS — M545 Low back pain: Secondary | ICD-10-CM

## 2019-08-30 DIAGNOSIS — M5459 Other low back pain: Secondary | ICD-10-CM

## 2019-08-30 MED ORDER — MELOXICAM 15 MG PO TABS: 7.5000 mg | ORAL_TABLET | Freq: Every day | ORAL | 5 refills | Status: DC | PRN

## 2019-08-30 NOTE — Progress Notes (Signed)
Patient: Colleen Andrade  Service Category: E/M  Provider: Gillis Santa, MD  DOB: [birth year]  DOS: 08/30/2019  Location: Office  MRN: [id number]  Setting: Ambulatory outpatient  Referring Provider: Donald Prose, MD  Type: Established Patient  Specialty: Interventional Pain Management  PCP: Donald Prose, MD  Location: Remote location  Delivery: TeleHealth     Virtual Encounter - Pain Management PROVIDER NOTE: Information contained herein reflects review and annotations entered in association with encounter. Interpretation of such information and data should be left to medically-trained personnel. Information provided to patient can be located elsewhere in the medical record under "Patient Instructions". Document created using STT-dictation technology, any transcriptional errors that may result from process are unintentional.    Contact & Pharmacy Preferred: [phone] Home: [phone] (home) Mobile: [phone] (mobile) E-mail: [email or web address]  [facility] #[id number] [address]ina [address], [place] [address] [place] [address] Phone: [phone] Fax: [phone]   Pre-screening  Colleen Andrade offered "in-person" vs "virtual" encounter. She indicated preferring virtual for this encounter.   Reason COVID-19*  Social distancing based on CDC and AMA recommendations.   I contacted Colleen Andrade on 08/30/2019 via telephone.      I clearly identified myself as Gillis Santa, MD. I verified that I was speaking with the correct person using two identifiers (Name: Colleen Andrade, and date of birth: 18-Aug-1955).  Consent I sought verbal advanced consent from Colleen Andrade for virtual visit interactions. I informed Colleen Andrade of possible security and privacy concerns, risks, and limitations associated with providing "not-in-person" medical evaluation and management services. I also informed Colleen Andrade of the availability of  "in-person" appointments. Finally, I informed her that there would be a charge for the virtual visit and that she could be  personally, fully or partially, financially responsible for it. Colleen Andrade expressed understanding and agreed to proceed.   Historic Elements   Colleen Andrade is a 64 y.o. year old, female patient evaluated today after her last contact with our practice on 08/29/2019. Colleen Andrade  has a past medical history of Asthma and Diabetes mellitus without complication ([place]). She also  has a past surgical history that includes Cholecystectomy. Colleen Andrade has a current medication list which includes the following prescription(s): duloxetine, glipizide, hydrocodone-acetaminophen, lisinopril, meloxicam, metformin, pregabalin, tizanidine, and sitagliptin. She  reports that she has been smoking cigarettes. She has never used smokeless tobacco. She reports that she does not drink alcohol or use drugs. Colleen Andrade has No Known Allergies.   HPI  Today, she is being contacted for medication management.  No change in medical history since last visit.  Patient recently underwent a move which she states was exhausting and has increased her musculoskeletal pain.   Patient continues multimodal pain regimen as prescribed.  States that it provides pain relief and improvement in functional status.  She presents for refill of Mobic.  She continues Lyrica, Cymbalta, tizanidine and hydrocodone as prescribed.  We discussed the importance of utilizing Mobic, tizanidine, hydrocodone only on an as-needed basis which the patient does.  Patient has only filled 1 prescription of hydrocodone earlier this month since her last visit with me.  She still has 2 prescriptions remaining at the pharmacy.  Pharmacotherapy Assessment  Analgesic:  08/12/2019  3   05/24/2019  Hydrocodone-Acetamin 10-325 MG  45.00  30 Bi Lat   [id number]   Wal (7587)   0  15.00 MME  Comm Ins   [place]  Monitoring: [place] PMP: PDMP  reviewed during this encounter.       Pharmacotherapy: No side-effects or adverse reactions reported. Compliance: No problems identified. Effectiveness: Clinically acceptable. Plan: Refer to "POC".  UDS:  Summary  Date Value Ref Range Status  03/01/2019 Comment  Final    Comment:    Note ==================================================================== ToxASSURE Select 13 (MW) ==================================================================== Test                             Result       Flag       Units Drug Present and Declared for Prescription Verification   Hydrocodone                    184          EXPECTED   ng/mg creat   Dihydrocodeine                 76           EXPECTED   ng/mg creat   Norhydrocodone                 240          EXPECTED   ng/mg creat    Sources of hydrocodone include scheduled prescription medications.    Dihydrocodeine and norhydrocodone are expected metabolites of    hydrocodone. Dihydrocodeine is also available as a scheduled    prescription medication. ==================================================================== Test                      Result    Flag   Units      Ref Range   Creatinine              124              mg/dL      >=20 ==================================================================== Declared Medications:  The flagging and interpretation on this report are based on the  following declared medications.  Unexpected results may arise from  inaccuracies in the declared medications.  **Note: The testing scope of this panel includes these medications:  Hydrocodone (Norco)  **Note: The testing scope of this panel does not include the  following reported medications:  Acetaminophen (Norco)  Duloxetine  Meloxicam (Mobic)  Metformin (Glucophage)  Pregabalin (Lyrica)  Sitagliptin (Januvia)  Tizanidine (Zanaflex) ==================================================================== For clinical consultation, please call  [phone]. ====================================================================    Laboratory Chemistry Profile   Renal Lab Results  Component Value Date   BUN 20 01/26/2018   CREATININE 0.75 01/26/2018   GFRAA >60 01/26/2018   GFRNONAA >60 01/26/2018    Hepatic Lab Results  Component Value Date   AST 45 (H) 01/26/2018   ALT 53 (H) 01/26/2018   ALBUMIN 4.0 01/26/2018   ALKPHOS 67 01/26/2018    Electrolytes Lab Results  Component Value Date   NA 138 01/26/2018   K 3.8 01/26/2018   CL 107 01/26/2018   CALCIUM 9.0 01/26/2018    Bone No results found for: VD25OH, VD125OH2TOT, DX8338SN0, NL9767HA1, 25OHVITD1, 25OHVITD2, 25OHVITD3, TESTOFREE, TESTOSTERONE  Inflammation (CRP: Acute Phase) (ESR: Chronic Phase) No results found for: CRP, ESRSEDRATE, LATICACIDVEN    Note: Above Lab results reviewed.  Assessment  The primary encounter diagnosis was Lumbar facet joint pain. Diagnoses of Cervical spondylosis (R C4,5,6,7) and Degenerative disc disease, cervical were also pertinent to this visit.  Plan of Care   Colleen Andrade has a current medication  list which includes the following long-term medication(s): duloxetine, glipizide, lisinopril, metformin, pregabalin, and sitagliptin.  Pharmacotherapy (Medications Ordered): Meds ordered this encounter  Medications  . meloxicam (MOBIC) 15 MG tablet    Sig: Take 0.5 tablets (7.5 mg total) by mouth daily as needed for pain.    Dispense:  30 tablet    Refill:  5   Follow-up plan:   Return if symptoms worsen or fail to improve.    Recent Visits No visits were found meeting these conditions.  Showing recent visits within past 90 days and meeting all other requirements   Today's Visits Date Type Provider Dept  08/30/19 Office Visit Gillis Santa, MD [facility]  Showing today's visits and meeting all other requirements   Future Appointments No visits were found meeting these conditions.  Showing future  appointments within next 90 days and meeting all other requirements   I discussed the assessment and treatment plan with the patient. The patient was provided an opportunity to ask questions and all were answered. The patient agreed with the plan and demonstrated an understanding of the instructions.  Patient advised to call back or seek an in-person evaluation if the symptoms or condition worsens.  Duration of encounter:14mnutes.  Note by: BGillis Santa MD Date: 08/30/2019; Time: 9:01 AM

## 2019-09-21 ENCOUNTER — Ambulatory Visit: Payer: Self-pay

## 2019-10-16 ENCOUNTER — Ambulatory Visit: Payer: Self-pay

## 2019-10-22 ENCOUNTER — Other Ambulatory Visit: Payer: Self-pay | Admitting: Student in an Organized Health Care Education/Training Program

## 2019-10-22 DIAGNOSIS — G894 Chronic pain syndrome: Secondary | ICD-10-CM

## 2019-11-21 ENCOUNTER — Other Ambulatory Visit: Payer: Self-pay | Admitting: Student in an Organized Health Care Education/Training Program

## 2019-11-21 DIAGNOSIS — G894 Chronic pain syndrome: Secondary | ICD-10-CM

## 2019-12-20 ENCOUNTER — Other Ambulatory Visit: Payer: Self-pay

## 2019-12-20 ENCOUNTER — Ambulatory Visit: Payer: 59 | Admitting: Student in an Organized Health Care Education/Training Program

## 2019-12-20 ENCOUNTER — Ambulatory Visit
Admission: RE | Admit: 2019-12-20 | Discharge: 2019-12-20 | Disposition: A | Discharge status: Home / Self Care | Payer: 59 | Source: Ambulatory Visit | Attending: Student in an Organized Health Care Education/Training Program | Admitting: Student in an Organized Health Care Education/Training Program

## 2019-12-20 ENCOUNTER — Ambulatory Visit
Admission: RE | Admit: 2019-12-20 | Discharge: 2019-12-20 | Disposition: A | Discharge status: Home / Self Care | Payer: 59 | Attending: Student in an Organized Health Care Education/Training Program | Admitting: Student in an Organized Health Care Education/Training Program

## 2019-12-20 ENCOUNTER — Encounter: Payer: Self-pay | Admitting: Student in an Organized Health Care Education/Training Program

## 2019-12-20 VITALS — BP 130/80 | HR 110 | Temp 97.8°F | Resp 16 | Ht 66.0 in | Wt 225.0 lb

## 2019-12-20 DIAGNOSIS — G894 Chronic pain syndrome: Secondary | ICD-10-CM

## 2019-12-20 DIAGNOSIS — M542 Cervicalgia: Secondary | ICD-10-CM | POA: Insufficient documentation

## 2019-12-20 DIAGNOSIS — M47812 Spondylosis without myelopathy or radiculopathy, cervical region: Secondary | ICD-10-CM | POA: Insufficient documentation

## 2019-12-20 DIAGNOSIS — M5412 Radiculopathy, cervical region: Secondary | ICD-10-CM | POA: Insufficient documentation

## 2019-12-20 MED ORDER — HYDROCODONE-ACETAMINOPHEN 10-325 MG PO TABS: 1.0000 | ORAL_TABLET | Freq: Every day | ORAL | 0 refills | Status: AC | PRN

## 2019-12-20 MED ORDER — DULOXETINE HCL 60 MG PO CPEP: 60.0000 mg | ORAL_CAPSULE | Freq: Every day | ORAL | 5 refills | Status: DC

## 2019-12-20 MED ORDER — PREGABALIN 100 MG PO CAPS: 100.0000 mg | ORAL_CAPSULE | Freq: Three times a day (TID) | ORAL | 5 refills | Status: DC

## 2019-12-20 MED ORDER — MELOXICAM 15 MG PO TABS: 7.5000 mg | ORAL_TABLET | Freq: Every day | ORAL | 5 refills | Status: AC | PRN

## 2019-12-20 MED ORDER — TIZANIDINE HCL 2 MG PO TABS: 2.0000 mg | ORAL_TABLET | Freq: Two times a day (BID) | ORAL | 5 refills | Status: AC | PRN

## 2019-12-20 NOTE — Progress Notes (Deleted)
Safety precautions to be maintained throughout the outpatient stay will include: orient to surroundings, keep bed in low position, maintain call bell within reach at all times, provide assistance with transfer out of bed and ambulation.  

## 2019-12-20 NOTE — Progress Notes (Signed)
PROVIDER NOTE: Information contained herein reflects review and annotations entered in association with encounter. Interpretation of such information and data should be left to medically-trained personnel. Information provided to patient can be located elsewhere in the medical record under "Patient Instructions". Document created using STT-dictation technology, any transcriptional errors that may result from process are unintentional.    Patient: Colleen Andrade  Service Category: E/M  Provider: Gillis Santa, MD  DOB: 30-Jul-1955  DOS: 12/20/2019  Specialty: Interventional Pain Management  MRN: [id number]  Setting: Ambulatory outpatient  PCP: Donald Prose, MD  Type: Established Patient    Referring Provider: Donald Prose, MD  Location: Office  Delivery: Face-to-face     HPI  Reason for encounter: Colleen Andrade, a 64 y.o. year old female, is here today for evaluation and management of her Cervical spondylosis [M47.812]. Colleen Andrade's primary complain today is Back Pain (cervical), Shoulder Pain (bilateral), and Neck Pain (left is worse ) Last encounter: Practice (11/21/2019). My last encounter with her was on 11/21/2019. Pertinent problems: Colleen Andrade has Cervical radiculopathy; Cervicalgia; Degenerative disc disease, cervical; Chronic pain syndrome; and Cervical spondylosis (R C4,5,6,7) on their pertinent problem list. Pain Assessment: Severity of Chronic pain is reported as a 7 /10. Location: Neck (see visit info) Left/into the arms focusing on upper arms and when the neck pain is severe raidates into left shoulder.. Onset: More than a month ago. Quality: Discomfort, Constant, Sharp, Sore, Tightness. Timing: Constant. Modifying factor(s): stretching, yoga makes it more comfortable. sleeping.. Vitals:  height is '5\' 6"'$  (1.676 m) and weight is 225 lb (102.1 kg). Her temporal temperature is 97.8 F (36.6 C). Her blood pressure is 130/80 and her pulse is 110 (abnormal). Her respiration is 16 and oxygen  saturation is 99%.   Patient presents for medication management and left-sided neck pain.  Patient's last visit with me was February 2021.  She has undergone a move and she states that has exacerbated her left-sided neck pain.  She is having similar symptoms on her left side of her neck that radiated down her left shoulder and left hand as she was having on the right side.  She is status post right C4, C5, C6, C7 cervical facet medial branch nerve block that was performed on 06/05/2018 that was helpful for her symptoms.  Given increased neck pain and with her last spinal x-ray of her cervical spine being in 2019, recommend repeating.  I will refill medications as below.   Pharmacotherapy Assessment   Analgesic: 09/26/2019  3   05/24/2019  Hydrocodone-Acetamin 10-325 MG  45.00  22 Bi Lat   [id number]   Wal ([id number])   0  20.45 MME  Comm Ins   [place]     Monitoring:  PMP: PDMP reviewed during this encounter.       Pharmacotherapy: No side-effects or adverse reactions reported. Compliance: No problems identified. Effectiveness: Clinically acceptable.  UDS:  Summary  Date Value Ref Range Status  03/01/2019 Comment  Final    Comment:    Note ==================================================================== [facility] (MW) ==================================================================== Test                             Result       Flag       Units Drug Present and Declared for Prescription Verification   Hydrocodone                    184  EXPECTED   ng/mg creat   Dihydrocodeine                 76           EXPECTED   ng/mg creat   Norhydrocodone                 240          EXPECTED   ng/mg creat    Sources of hydrocodone include scheduled prescription medications.    Dihydrocodeine and norhydrocodone are expected metabolites of    hydrocodone. Dihydrocodeine is also available as a scheduled    prescription  medication. ==================================================================== Test                      Result    Flag   Units      Ref Range   Creatinine              124              mg/dL      >=68 ==================================================================== Declared Medications:  The flagging and interpretation on this report are based on the  following declared medications.  Unexpected results may arise from  inaccuracies in the declared medications.  **Note: The testing scope of this panel includes these medications:  Hydrocodone (Norco)  **Note: The testing scope of this panel does not include the  following reported medications:  Acetaminophen (Norco)  Duloxetine  Meloxicam (Mobic)  Metformin (Glucophage)  Pregabalin (Lyrica)  Sitagliptin (Januvia)  Tizanidine (Zanaflex) ==================================================================== For clinical consultation, please call [phone]. ====================================================================       ROS  Constitutional: Denies any fever or chills Gastrointestinal: No reported hemesis, hematochezia, vomiting, or acute GI distress Musculoskeletal: Denies any acute onset joint swelling, redness, loss of ROM, or weakness Neurological: No reported episodes of acute onset apraxia, aphasia, dysarthria, agnosia, amnesia, paralysis, loss of coordination, or loss of consciousness  Medication Review  DULoxetine, HYDROcodone-acetaminophen, glipiZIDE, lisinopril, meloxicam, metFORMIN, pregabalin, sitaGLIPtin, and tiZANidine  History Review  Allergy: Colleen Andrade has No Known Allergies. Drug: Colleen Andrade  reports no history of drug use. Alcohol:  reports no history of alcohol use. Tobacco:  reports that she has been smoking cigarettes. She has never used smokeless tobacco. Social: Colleen Andrade  reports that she has been smoking cigarettes. She has never used smokeless tobacco. She reports that she  does not drink alcohol and does not use drugs. Medical:  has a past medical history of Asthma and Diabetes mellitus without complication (HCC). Surgical: Colleen Andrade  has a past surgical history that includes Cholecystectomy. Family: family history is not on file.  Laboratory Chemistry Profile   Renal Lab Results  Component Value Date   BUN 20 01/26/2018   CREATININE 0.75 01/26/2018   GFRAA >60 01/26/2018   GFRNONAA >60 01/26/2018     Hepatic Lab Results  Component Value Date   AST 45 (H) 01/26/2018   ALT 53 (H) 01/26/2018   ALBUMIN 4.0 01/26/2018   ALKPHOS 67 01/26/2018     Electrolytes Lab Results  Component Value Date   NA 138 01/26/2018   K 3.8 01/26/2018   CL 107 01/26/2018   CALCIUM 9.0 01/26/2018     Bone No results found for: VD25OH, VD125OH2TOT, [id number], XC9733AN8, 25OHVITD1, 25OHVITD2, 25OHVITD3, TESTOFREE, TESTOSTERONE   Inflammation (CRP: Acute Phase) (ESR: Chronic Phase) No results found for: CRP, ESRSEDRATE, LATICACIDVEN     Note: Above Lab results reviewed.  Recent Imaging Review  DG C-Arm 1-60 Min-No Report Fluoroscopy was utilized by the requesting physician.  No radiographic  interpretation.  Note: Reviewed        Physical Exam  General appearance: Well nourished, well developed, and well hydrated. In no apparent acute distress Mental status: Alert, oriented x 3 (person, place, & time)       Respiratory: No evidence of acute respiratory distress Eyes: PERLA Vitals: BP 130/80 (BP Location: Left Arm, Patient Position: Sitting, Cuff Size: Normal)   Pulse (!) 110   Temp 97.8 F (36.6 C) (Temporal)   Resp 16   Ht '5\' 6"'$  (1.676 m)   Wt 225 lb (102.1 kg)   SpO2 99%   BMI 36.32 kg/m  BMI: Estimated body mass index is 36.32 kg/m as calculated from the following:   Height as of this encounter: '5\' 6"'$  (1.676 m).   Weight as of this encounter: 225 lb (102.1 kg). Ideal: Ideal body weight: 59.3 kg (130 lb 11.7 oz) Adjusted ideal body weight:  76.4 kg (168 lb 7 oz) Cervical Spine Area Exam  Skin & Axial Inspection: No masses, redness, edema, swelling, or associated skin lesions Alignment: Symmetrical Functional ROM: Pain restricted ROM, bilaterally left greater than right Stability: No instability detected Muscle Tone/Strength: Functionally intact. No obvious neuro-muscular anomalies detected. Sensory (Neurological): Musculoskeletal pain pattern Palpation: No palpable anomalies             Upper Extremity (UE) Exam    Side: Right upper extremity  Side: Left upper extremity  Skin & Extremity Inspection: Skin color, temperature, and hair growth are WNL. No peripheral edema or cyanosis. No masses, redness, swelling, asymmetry, or associated skin lesions. No contractures.  Skin & Extremity Inspection: Skin color, temperature, and hair growth are WNL. No peripheral edema or cyanosis. No masses, redness, swelling, asymmetry, or associated skin lesions. No contractures.  Functional ROM: Unrestricted ROM          Functional ROM: Pain restricted ROM for shoulder and elbow  Muscle Tone/Strength: Functionally intact. No obvious neuro-muscular anomalies detected.  Muscle Tone/Strength: Functionally intact. No obvious neuro-muscular anomalies detected.  Sensory (Neurological): Unimpaired          Sensory (Neurological): Musculoskeletal pain pattern          Palpation: No palpable anomalies              Palpation: No palpable anomalies              Provocative Test(s):  Phalen's test: deferred Tinel's test: deferred Apley's scratch test (touch opposite shoulder):  Action 1 (Across chest): deferred Action 2 (Overhead): deferred Action 3 (LB reach): deferred   Provocative Test(s):  Phalen's test: deferred Tinel's test: deferred Apley's scratch test (touch opposite shoulder):  Action 1 (Across chest): deferred Action 2 (Overhead): deferred Action 3 (LB reach): deferred    Assessment   Status Diagnosis  Having a Flare-up Having a  Flare-up Having a Flare-up 1. Cervical spondylosis    2. Cervical radiculopathy   3. Cervicalgia   4. Chronic pain syndrome      Updated Problems: Problem  Cervical spondylosis (R C4,5,6,7)  Cervical Radiculopathy  Cervicalgia  Degenerative Disc Disease, Cervical  Chronic Pain Syndrome    Plan of Care   Colleen Andrade has a current medication list which includes the following long-term medication(s): duloxetine, glipizide, lisinopril, metformin, pregabalin, and sitagliptin.  1.  Refill medications as below 2.  Given increased left neck pain, cervical spine x-ray and consider treatment plan pending results.  Pharmacotherapy (Medications Ordered): Meds ordered this encounter  Medications  . DULoxetine (CYMBALTA) 60 MG capsule    Sig: Take 1 capsule (60 mg total) by mouth daily.    Dispense:  30 capsule    Refill:  5  . HYDROcodone-acetaminophen (NORCO) 10-325 MG tablet    Sig: Take 1-2 tablets by mouth daily as needed.    Dispense:  45 tablet    Refill:  0  . HYDROcodone-acetaminophen (NORCO) 10-325 MG tablet    Sig: Take 1-2 tablets by mouth daily as needed.    Dispense:  45 tablet    Refill:  0  . HYDROcodone-acetaminophen (NORCO) 10-325 MG tablet    Sig: Take 1-2 tablets by mouth daily as needed.    Dispense:  45 tablet    Refill:  0  . meloxicam (MOBIC) 15 MG tablet    Sig: Take 0.5 tablets (7.5 mg total) by mouth daily as needed for pain.    Dispense:  30 tablet    Refill:  5  . pregabalin (LYRICA) 100 MG capsule    Sig: Take 1 capsule (100 mg total) by mouth 3 (three) times daily.    Dispense:  90 capsule    Refill:  5    Do not place this medication, or any other prescription from our practice, on "Automatic Refill". Patient may have prescription filled one day early if pharmacy is closed on scheduled refill date.  Marland Kitchen tiZANidine (ZANAFLEX) 2 MG tablet    Sig: Take 1 tablet (2 mg total) by mouth 2 (two) times daily as needed for muscle spasms.    Dispense:   30 tablet    Refill:  5   Orders:  Orders Placed This Encounter  Procedures  . DG Cervical Spine With Flex & Extend    Patient presents with axial pain with possible radicular component.  In addition to any acute findings, please report on:  1. Facet (Zygapophyseal) joint DJD (Hypertrophy, space narrowing, subchondral sclerosis, and/or osteophyte formation) 2. DDD and/or IVDD (Loss of disc height, desiccation or "Black disc disease") 3. Pars defects 4. Spondylolisthesis, spondylosis, and/or spondyloarthropathies (include Degree/Grade of displacement in mm) 5. Vertebral body Fractures, including age (old, new/acute) 6. Modic Type Changes 7. Demineralization 8. Bone pathology 9. Central, Lateral Recess, and/or Foraminal Stenosis (include AP diameter of stenosis in mm) 10. Surgical changes (hardware type, status, and presence of fibrosis) NOTE: Please specify level(s) and laterality. If applicable: Please indicate ROM and/or evidence of instability (>39mm displacement between flexion and extension views)    Standing Status:   Future    Standing Expiration Date:   03/21/2020    Scheduling Instructions:     Please evaluate for any evidence of cervical spine instability. Describe the presence of any spondylolisthesis (Antero- or retrolisthesis). If present, provide displacement "Grade" and measurement in cm. Please describe presence and specific location (Level & Laterality) of any signs of      osteoarthritis, zygapophyseal (Facet) joints DJD (including decreased joint space and/or osteophytosis), DDD, Foraminal narrowing, as well as any sclerosis and/or cyst formation. Please comment on ROM.    Order Specific Question:   Reason for Exam (SYMPTOM  OR DIAGNOSIS REQUIRED)    Answer:   Cervicalgia    Order Specific Question:   Preferred imaging location?    Answer:   [facility]    Order Specific Question:   Call Results- Best Contact Number?    Answer:   [phone]) [phone] [facility])     Order Specific  Question:   Radiology Contrast Protocol - do NOT remove file path    Answer:   [email or web address]   Follow-up plan:   Return in about 3 months (around 03/21/2020) for Medication Management, in person.   Recent Visits No visits were found meeting these conditions. Showing recent visits within past 90 days and meeting all other requirements Today's Visits Date Type Provider Dept  12/20/19 Office Visit Gillis Santa, MD [facility]  Showing today's visits and meeting all other requirements Future Appointments No visits were found meeting these conditions. Showing future appointments within next 90 days and meeting all other requirements  I discussed the assessment and treatment plan with the patient. The patient was provided an opportunity to ask questions and all were answered. The patient agreed with the plan and demonstrated an understanding of the instructions.  Patient advised to call back or seek an in-person evaluation if the symptoms or condition worsens.  Duration of encounter: 30 minutes.  Note by: Gillis Santa, MD Date: 12/20/2019; Time: 8:51 AM

## 2019-12-20 NOTE — Progress Notes (Signed)
Nursing Pain Medication Assessment:  Safety precautions to be maintained throughout the outpatient stay will include: orient to surroundings, keep bed in low position, maintain call bell within reach at all times, provide assistance with transfer out of bed and ambulation.  Medication Inspection Compliance: Ms. Paolo did not comply with our request to bring her pills to be counted. She was reminded that bringing the medication bottles, even when empty, is a requirement.  Medication: None brought in. Pill/Patch Count: None available to be counted. Bottle Appearance: No container available. Did not bring bottle(s) to appointment. Filled Date: N/A Last Medication intake:  2 - 3 weeks ago

## 2019-12-24 ENCOUNTER — Telehealth: Payer: Self-pay | Admitting: *Deleted

## 2019-12-24 NOTE — Telephone Encounter (Signed)
Called patient per Dr Cherylann Ratel to let her know results of cervical xray.  Voicemail left that there was not a significant change from xray done on 03/20/98

## 2019-12-25 ENCOUNTER — Other Ambulatory Visit: Payer: Self-pay | Admitting: Physician Assistant

## 2019-12-25 DIAGNOSIS — Z1231 Encounter for screening mammogram for malignant neoplasm of breast: Secondary | ICD-10-CM

## 2020-02-21 ENCOUNTER — Ambulatory Visit
Admission: RE | Admit: 2020-02-21 | Discharge: 2020-02-21 | Disposition: A | Discharge status: Home / Self Care | Payer: 59 | Source: Ambulatory Visit | Attending: Physician Assistant | Admitting: Physician Assistant

## 2020-02-21 ENCOUNTER — Other Ambulatory Visit: Payer: Self-pay

## 2020-02-21 DIAGNOSIS — Z1231 Encounter for screening mammogram for malignant neoplasm of breast: Secondary | ICD-10-CM

## 2020-03-20 ENCOUNTER — Encounter: Payer: 59 | Admitting: Student in an Organized Health Care Education/Training Program

## 2020-06-23 ENCOUNTER — Other Ambulatory Visit: Payer: Self-pay | Admitting: Student in an Organized Health Care Education/Training Program

## 2020-06-23 DIAGNOSIS — M5412 Radiculopathy, cervical region: Secondary | ICD-10-CM

## 2020-06-23 DIAGNOSIS — G894 Chronic pain syndrome: Secondary | ICD-10-CM

## 2020-06-23 DIAGNOSIS — M47812 Spondylosis without myelopathy or radiculopathy, cervical region: Secondary | ICD-10-CM

## 2020-06-23 MED ORDER — PREGABALIN 100 MG PO CAPS: 100.0000 mg | ORAL_CAPSULE | Freq: Three times a day (TID) | ORAL | 5 refills | Status: DC

## 2020-06-23 NOTE — Telephone Encounter (Signed)
The patient called to say the pharmacy said her script for pregabalin is too old. They need an updated script. She is there now.

## 2020-06-23 NOTE — Progress Notes (Signed)
Patient notified

## 2020-06-25 ENCOUNTER — Encounter: Payer: 59 | Admitting: Student in an Organized Health Care Education/Training Program

## 2020-07-30 ENCOUNTER — Other Ambulatory Visit: Payer: Self-pay

## 2020-07-30 DIAGNOSIS — Z20822 Contact with and (suspected) exposure to covid-19: Secondary | ICD-10-CM

## 2020-07-31 LAB — SARS-COV-2, NAA 2 DAY TAT

## 2020-07-31 LAB — NOVEL CORONAVIRUS, NAA: SARS-CoV-2, NAA: NOT DETECTED

## 2020-10-23 DIAGNOSIS — E1140 Type 2 diabetes mellitus with diabetic neuropathy, unspecified: Secondary | ICD-10-CM | POA: Diagnosis not present

## 2020-10-23 DIAGNOSIS — I10 Essential (primary) hypertension: Secondary | ICD-10-CM | POA: Diagnosis not present

## 2020-10-23 DIAGNOSIS — Z7984 Long term (current) use of oral hypoglycemic drugs: Secondary | ICD-10-CM | POA: Diagnosis not present

## 2020-11-05 ENCOUNTER — Other Ambulatory Visit: Payer: Self-pay | Admitting: Student in an Organized Health Care Education/Training Program

## 2020-11-05 DIAGNOSIS — M5412 Radiculopathy, cervical region: Secondary | ICD-10-CM

## 2020-11-05 DIAGNOSIS — M47812 Spondylosis without myelopathy or radiculopathy, cervical region: Secondary | ICD-10-CM

## 2020-11-05 DIAGNOSIS — G894 Chronic pain syndrome: Secondary | ICD-10-CM

## 2020-11-10 DIAGNOSIS — E119 Type 2 diabetes mellitus without complications: Secondary | ICD-10-CM | POA: Diagnosis not present

## 2021-03-17 DIAGNOSIS — I10 Essential (primary) hypertension: Secondary | ICD-10-CM | POA: Diagnosis not present

## 2021-03-17 DIAGNOSIS — Z7984 Long term (current) use of oral hypoglycemic drugs: Secondary | ICD-10-CM | POA: Diagnosis not present

## 2021-03-17 DIAGNOSIS — E1140 Type 2 diabetes mellitus with diabetic neuropathy, unspecified: Secondary | ICD-10-CM | POA: Diagnosis not present

## 2021-03-17 DIAGNOSIS — J4520 Mild intermittent asthma, uncomplicated: Secondary | ICD-10-CM | POA: Diagnosis not present

## 2021-03-17 DIAGNOSIS — F418 Other specified anxiety disorders: Secondary | ICD-10-CM | POA: Diagnosis not present

## 2021-06-19 DIAGNOSIS — Z7984 Long term (current) use of oral hypoglycemic drugs: Secondary | ICD-10-CM | POA: Diagnosis not present

## 2021-06-19 DIAGNOSIS — Z23 Encounter for immunization: Secondary | ICD-10-CM | POA: Diagnosis not present

## 2021-06-19 DIAGNOSIS — E6609 Other obesity due to excess calories: Secondary | ICD-10-CM | POA: Diagnosis not present

## 2021-06-19 DIAGNOSIS — E1165 Type 2 diabetes mellitus with hyperglycemia: Secondary | ICD-10-CM | POA: Diagnosis not present

## 2021-06-19 DIAGNOSIS — F418 Other specified anxiety disorders: Secondary | ICD-10-CM | POA: Diagnosis not present

## 2021-09-02 DIAGNOSIS — J209 Acute bronchitis, unspecified: Secondary | ICD-10-CM | POA: Diagnosis not present

## 2021-09-02 DIAGNOSIS — Z20822 Contact with and (suspected) exposure to covid-19: Secondary | ICD-10-CM | POA: Diagnosis not present

## 2021-09-02 DIAGNOSIS — R059 Cough, unspecified: Secondary | ICD-10-CM | POA: Diagnosis not present

## 2021-11-24 DIAGNOSIS — E1140 Type 2 diabetes mellitus with diabetic neuropathy, unspecified: Secondary | ICD-10-CM | POA: Diagnosis not present

## 2021-11-24 DIAGNOSIS — M5030 Other cervical disc degeneration, unspecified cervical region: Secondary | ICD-10-CM | POA: Diagnosis not present

## 2021-11-24 DIAGNOSIS — F418 Other specified anxiety disorders: Secondary | ICD-10-CM | POA: Diagnosis not present

## 2021-11-24 DIAGNOSIS — I10 Essential (primary) hypertension: Secondary | ICD-10-CM | POA: Diagnosis not present

## 2022-02-24 DIAGNOSIS — Z0000 Encounter for general adult medical examination without abnormal findings: Secondary | ICD-10-CM | POA: Diagnosis not present

## 2022-02-24 DIAGNOSIS — F418 Other specified anxiety disorders: Secondary | ICD-10-CM | POA: Diagnosis not present

## 2022-02-24 DIAGNOSIS — E1140 Type 2 diabetes mellitus with diabetic neuropathy, unspecified: Secondary | ICD-10-CM | POA: Diagnosis not present

## 2022-02-24 DIAGNOSIS — I10 Essential (primary) hypertension: Secondary | ICD-10-CM | POA: Diagnosis not present

## 2022-02-24 DIAGNOSIS — Z23 Encounter for immunization: Secondary | ICD-10-CM | POA: Diagnosis not present

## 2022-02-24 DIAGNOSIS — Z13220 Encounter for screening for lipoid disorders: Secondary | ICD-10-CM | POA: Diagnosis not present

## 2022-02-24 DIAGNOSIS — J4520 Mild intermittent asthma, uncomplicated: Secondary | ICD-10-CM | POA: Diagnosis not present

## 2022-08-26 ENCOUNTER — Other Ambulatory Visit: Payer: Self-pay | Admitting: Family Medicine

## 2022-08-26 DIAGNOSIS — Z1231 Encounter for screening mammogram for malignant neoplasm of breast: Secondary | ICD-10-CM

## 2022-08-27 DIAGNOSIS — E1140 Type 2 diabetes mellitus with diabetic neuropathy, unspecified: Secondary | ICD-10-CM | POA: Diagnosis not present

## 2022-08-27 DIAGNOSIS — J4520 Mild intermittent asthma, uncomplicated: Secondary | ICD-10-CM | POA: Diagnosis not present

## 2022-08-27 DIAGNOSIS — I10 Essential (primary) hypertension: Secondary | ICD-10-CM | POA: Diagnosis not present

## 2022-08-27 DIAGNOSIS — F418 Other specified anxiety disorders: Secondary | ICD-10-CM | POA: Diagnosis not present

## 2022-08-29 LAB — BASIC METABOLIC PANEL WITH GFR
BUN: 19 (ref 4–21)
CO2: 20 (ref 13–22)
Chloride: 104 (ref 99–108)
Creatinine: 0.8 (ref 0.5–1.1)
Glucose: 84
Potassium: 5 meq/L (ref 3.5–5.1)
Sodium: 139 (ref 137–147)

## 2022-08-29 LAB — COMPREHENSIVE METABOLIC PANEL
Albumin: 4.7 (ref 3.5–5.0)
Calcium: 9.6 (ref 8.7–10.7)
eGFR: 80

## 2022-08-29 LAB — PROTEIN / CREATININE RATIO, URINE
Albumin, U: 15.7
Creatinine, Urine: 158.5

## 2022-08-29 LAB — MICROALBUMIN / CREATININE URINE RATIO: Microalb Creat Ratio: 10

## 2022-08-29 LAB — HEMOGLOBIN A1C: Hemoglobin A1C: 5.5

## 2022-08-29 LAB — LIPID PANEL
Cholesterol: 170 (ref 0–200)
HDL: 51 (ref 35–70)
LDL Cholesterol: 89
Triglycerides: 173 — AB (ref 40–160)

## 2022-09-06 ENCOUNTER — Other Ambulatory Visit: Payer: Self-pay

## 2022-09-06 ENCOUNTER — Ambulatory Visit
Admission: RE | Admit: 2022-09-06 | Discharge: 2022-09-06 | Disposition: A | Discharge status: Home / Self Care | Payer: BC Managed Care – PPO | Source: Ambulatory Visit | Attending: Family Medicine | Admitting: Family Medicine

## 2022-09-06 ENCOUNTER — Telehealth: Payer: Self-pay

## 2022-09-06 DIAGNOSIS — Z86010 Personal history of colonic polyps: Secondary | ICD-10-CM

## 2022-09-06 DIAGNOSIS — Z1231 Encounter for screening mammogram for malignant neoplasm of breast: Secondary | ICD-10-CM | POA: Insufficient documentation

## 2022-09-06 DIAGNOSIS — Z800 Family history of malignant neoplasm of digestive organs: Secondary | ICD-10-CM

## 2022-09-06 MED ORDER — NA SULFATE-K SULFATE-MG SULF 17.5-3.13-1.6 GM/177ML PO SOLN: 1.0000 | Freq: Once | ORAL | 0 refills | Status: AC

## 2022-09-06 NOTE — Telephone Encounter (Signed)
Gastroenterology Pre-Procedure Review  Request Date: 10/11/22 Requesting Physician: Dr. Allen Norris  PATIENT REVIEW QUESTIONS: The patient responded to the following health history questions as indicated:    1. Are you having any GI issues? no 2. Do you have a personal history of Polyps? yes (patient stated she has personal history of colon polyps. No record of this colonoscopy) 3. Do you have a family history of Colon Cancer or Polyps? yes (mother colon cancer) 4. Diabetes Mellitus? yes (Stop Rybelsus 7 days prior to procedure on 10/04/22. Stop Metformin 2 days before procedure on 10/09/22.  Stop Glipizide 1 day before procedure on 10/10/22) 5. Joint replacements in the past 12 months?no 6. Major health problems in the past 3 months?no 7. Any artificial heart valves, MVP, or defibrillator?no    MEDICATIONS & ALLERGIES:    Patient reports the following regarding taking any anticoagulation/antiplatelet therapy:   Plavix, Coumadin, Eliquis, Xarelto, Lovenox, Pradaxa, Brilinta, or Effient? no Aspirin? no  Patient confirms/reports the following medications:  Current Outpatient Medications  Medication Sig Dispense Refill   DULoxetine (CYMBALTA) 60 MG capsule Take 1 capsule (60 mg total) by mouth daily. 30 capsule 5   glipiZIDE (GLUCOTROL XL) 10 MG 24 hr tablet Take 10 mg by mouth every morning.     lisinopril (ZESTRIL) 5 MG tablet Take 5 mg by mouth daily.     meloxicam (MOBIC) 15 MG tablet Take 0.5 tablets (7.5 mg total) by mouth daily as needed for pain. 30 tablet 5   metFORMIN (GLUCOPHAGE) 1000 MG tablet Take 1,000 mg by mouth 2 (two) times daily with a meal.     pregabalin (LYRICA) 100 MG capsule Take 1 capsule (100 mg total) by mouth 3 (three) times daily. 90 capsule 5   sitaGLIPtin (JANUVIA) 100 MG tablet Take 100 mg by mouth daily. (Patient not taking: Reported on 12/20/2019)     tiZANidine (ZANAFLEX) 2 MG tablet Take 1 tablet (2 mg total) by mouth 2 (two) times daily as needed for muscle  spasms. 30 tablet 5   No current facility-administered medications for this visit.    Patient confirms/reports the following allergies:  No Known Allergies  No orders of the defined types were placed in this encounter.   AUTHORIZATION INFORMATION Primary Insurance: 1D#: Group #:  Secondary Insurance: 1D#: Group #:  SCHEDULE INFORMATION: Date: 10/11/22 Time: Location: [facility]

## 2022-09-30 ENCOUNTER — Encounter: Payer: Self-pay | Admitting: Gastroenterology

## 2022-10-06 ENCOUNTER — Encounter: Payer: Self-pay | Admitting: Anesthesiology

## 2022-10-06 NOTE — Anesthesia Preprocedure Evaluation (Deleted)
Anesthesia Evaluation    Airway        Dental   Pulmonary asthma , Current Smoker          Cardiovascular      Neuro/Psych Cervical radiculopathy    GI/Hepatic   Endo/Other  diabetes, Well Controlled, Type 2, Oral Hypoglycemic Agents    Renal/GU      Musculoskeletal  (+) Arthritis ,    Abdominal   Peds  Hematology   Anesthesia Other Findings Asthma diabetes   Reproductive/Obstetrics                             Anesthesia Physical Anesthesia Plan Anesthesia Quick Evaluation

## 2022-10-11 ENCOUNTER — Encounter
Admission: RE | Disposition: A | Discharge status: Home / Self Care | Payer: Self-pay | Source: Home / Self Care | Attending: Gastroenterology

## 2022-10-11 ENCOUNTER — Ambulatory Visit
Admission: RE | Admit: 2022-10-11 | Discharge: 2022-10-11 | Disposition: A | Discharge status: Home / Self Care | Payer: BC Managed Care – PPO | Attending: Gastroenterology | Admitting: Gastroenterology

## 2022-10-11 ENCOUNTER — Encounter: Payer: Self-pay | Admitting: Gastroenterology

## 2022-10-11 ENCOUNTER — Ambulatory Visit: Payer: BC Managed Care – PPO | Admitting: Certified Registered"

## 2022-10-11 ENCOUNTER — Other Ambulatory Visit: Payer: Self-pay

## 2022-10-11 DIAGNOSIS — F17200 Nicotine dependence, unspecified, uncomplicated: Secondary | ICD-10-CM | POA: Insufficient documentation

## 2022-10-11 DIAGNOSIS — F17290 Nicotine dependence, other tobacco product, uncomplicated: Secondary | ICD-10-CM | POA: Diagnosis not present

## 2022-10-11 DIAGNOSIS — Z86010 Personal history of colonic polyps: Secondary | ICD-10-CM

## 2022-10-11 DIAGNOSIS — K219 Gastro-esophageal reflux disease without esophagitis: Secondary | ICD-10-CM | POA: Insufficient documentation

## 2022-10-11 DIAGNOSIS — Z800 Family history of malignant neoplasm of digestive organs: Secondary | ICD-10-CM

## 2022-10-11 DIAGNOSIS — J45909 Unspecified asthma, uncomplicated: Secondary | ICD-10-CM | POA: Insufficient documentation

## 2022-10-11 DIAGNOSIS — E119 Type 2 diabetes mellitus without complications: Secondary | ICD-10-CM | POA: Diagnosis not present

## 2022-10-11 DIAGNOSIS — K635 Polyp of colon: Secondary | ICD-10-CM

## 2022-10-11 DIAGNOSIS — Z7984 Long term (current) use of oral hypoglycemic drugs: Secondary | ICD-10-CM | POA: Diagnosis not present

## 2022-10-11 DIAGNOSIS — Z1211 Encounter for screening for malignant neoplasm of colon: Secondary | ICD-10-CM | POA: Diagnosis not present

## 2022-10-11 DIAGNOSIS — K640 First degree hemorrhoids: Secondary | ICD-10-CM | POA: Diagnosis not present

## 2022-10-11 DIAGNOSIS — Z122 Encounter for screening for malignant neoplasm of respiratory organs: Secondary | ICD-10-CM

## 2022-10-11 DIAGNOSIS — M1990 Unspecified osteoarthritis, unspecified site: Secondary | ICD-10-CM | POA: Insufficient documentation

## 2022-10-11 DIAGNOSIS — K5730 Diverticulosis of large intestine without perforation or abscess without bleeding: Secondary | ICD-10-CM | POA: Insufficient documentation

## 2022-10-11 DIAGNOSIS — D125 Benign neoplasm of sigmoid colon: Secondary | ICD-10-CM | POA: Diagnosis not present

## 2022-10-11 HISTORY — PX: COLONOSCOPY WITH PROPOFOL: SHX5780

## 2022-10-11 LAB — GLUCOSE, CAPILLARY: Glucose-Capillary: 111 mg/dL — ABNORMAL HIGH (ref 70–99)

## 2022-10-11 SURGERY — COLONOSCOPY WITH PROPOFOL
Anesthesia: General

## 2022-10-11 MED ORDER — LIDOCAINE HCL (CARDIAC) PF 100 MG/5ML IV SOSY
PREFILLED_SYRINGE | INTRAVENOUS | Status: DC | PRN
  Administered 2022-10-11: 40 mg via INTRAVENOUS

## 2022-10-11 MED ORDER — SODIUM CHLORIDE 0.9 % IV SOLN: INTRAVENOUS | Status: DC

## 2022-10-11 MED ORDER — STERILE WATER FOR IRRIGATION IR SOLN
Status: DC | PRN
  Administered 2022-10-11: 1000 mL

## 2022-10-11 MED ORDER — PROPOFOL 10 MG/ML IV BOLUS
INTRAVENOUS | Status: DC | PRN
  Administered 2022-10-11: 150 ug/kg/min via INTRAVENOUS
  Administered 2022-10-11: 50 mg via INTRAVENOUS

## 2022-10-11 MED ORDER — LACTATED RINGERS IV SOLN: INTRAVENOUS | Status: DC

## 2022-10-11 MED ORDER — STERILE WATER FOR IRRIGATION IR SOLN: Status: DC | PRN

## 2022-10-11 SURGICAL SUPPLY — 21 items

## 2022-10-11 NOTE — Anesthesia Preprocedure Evaluation (Addendum)
Anesthesia Evaluation  Patient identified by MRN, date of birth, ID band Patient awake    Reviewed: Allergy & Precautions, H&P , NPO status , Patient's Chart, lab work & pertinent test results  Airway Mallampati: III  TM Distance: <3 FB Neck ROM: Full    Dental   Poor dentition, none loose :   Pulmonary asthma , Current Smoker and Patient abstained from smoking. Seasonal allergies, coughing and sneezing worsen asthma   Pulmonary exam normal breath sounds clear to auscultation       Cardiovascular negative cardio ROS Normal cardiovascular exam Rhythm:Regular Rate:Normal     Neuro/Psych  Headaches Has not had migraines nor tinnitus since menopause,   negative psych ROS   GI/Hepatic Neg liver ROS,GERD  ,,  Endo/Other  diabetes, Type 2, Oral Hypoglycemic Agents    Renal/GU negative Renal ROS  negative genitourinary   Musculoskeletal  (+) Arthritis , Osteoarthritis,    Abdominal   Peds negative pediatric ROS (+)  Hematology negative hematology ROS (+)   Anesthesia Other Findings   Reproductive/Obstetrics negative OB ROS                             Anesthesia Physical Anesthesia Plan  ASA: 2  Anesthesia Plan: General   Post-op Pain Management:    Induction: Intravenous  PONV Risk Score and Plan:   Airway Management Planned: Natural Airway and Nasal Cannula  Additional Equipment:   Intra-op Plan:   Post-operative Plan:   Informed Consent: I have reviewed the patients History and Physical, chart, labs and discussed the procedure including the risks, benefits and alternatives for the proposed anesthesia with the patient or authorized representative who has indicated his/her understanding and acceptance.     Dental Advisory Given  Plan Discussed with: Anesthesiologist, CRNA and Surgeon  Anesthesia Plan Comments: (Patient consented for risks of anesthesia including but not  limited to:  - adverse reactions to medications - risk of airway placement if required - damage to eyes, teeth, lips or other oral mucosa - nerve damage due to positioning  - sore throat or hoarseness - Damage to heart, brain, nerves, lungs, other parts of body or loss of life  Patient voiced understanding.)       Anesthesia Quick Evaluation

## 2022-10-11 NOTE — Anesthesia Postprocedure Evaluation (Signed)
Anesthesia Post Note  Patient: Colleen Andrade  Procedure(s) Performed: COLONOSCOPY WITH PROPOFOL with polypectomy  Patient location during evaluation: PACU Anesthesia Type: General Level of consciousness: awake and alert Pain management: pain level controlled Vital Signs Assessment: post-procedure vital signs reviewed and stable Respiratory status: spontaneous breathing, nonlabored ventilation, respiratory function stable and patient connected to nasal cannula oxygen Cardiovascular status: blood pressure returned to baseline and stable Postop Assessment: no apparent nausea or vomiting Anesthetic complications: no   No notable events documented.   Last Vitals:  Vitals:   10/11/22 1155 10/11/22 1200  BP:  125/64  Pulse: (!) 59 (!) 56  Resp: 14 12  Temp:  36.7 C  SpO2: 98% 98%    Last Pain:  Vitals:   10/11/22 1200  TempSrc:   PainSc: 0-No pain                 Leilanny Fluitt C Trinita Devlin

## 2022-10-11 NOTE — Transfer of Care (Signed)
Immediate Anesthesia Transfer of Care Note  Patient: Colleen Andrade  Procedure(s) Performed: COLONOSCOPY WITH PROPOFOL with polypectomy  Patient Location: PACU  Anesthesia Type: General  Level of Consciousness: awake, alert  and patient cooperative  Airway and Oxygen Therapy: Patient Spontanous Breathing and Patient connected to supplemental oxygen  Post-op Assessment: Post-op Vital signs reviewed, Patient's Cardiovascular Status Stable, Respiratory Function Stable, Patent Airway and No signs of Nausea or vomiting  Post-op Vital Signs: Reviewed and stable  Complications: No notable events documented.

## 2022-10-11 NOTE — H&P (Signed)
Midge Minium, MD [facility] [address] [place], [place] [address] Phone: [phone] Fax : [phone]  Primary Care Physician:  Deatra James, MD Primary Gastroenterologist:  Dr. Servando Snare  Pre-Procedure History & Physical: HPI:  Colleen Andrade is a 67 y.o. female is here for a screening colonoscopy.   Past Medical History:  Diagnosis Date   Asthma    Diabetes mellitus without complication     Past Surgical History:  Procedure Laterality Date   CHOLECYSTECTOMY      Prior to Admission medications   Medication Sig Start Date End Date Taking? Authorizing Provider  albuterol (VENTOLIN HFA) 108 (90 Base) MCG/ACT inhaler Inhale into the lungs. 08/27/22  Yes [provider]  ASPIRIN 81 PO Take by mouth daily.   Yes [provider]  DULoxetine (CYMBALTA) 60 MG capsule Take 1 capsule (60 mg total) by mouth daily. 12/20/19  Yes Edward Jolly, MD  glipiZIDE (GLUCOTROL XL) 10 MG 24 hr tablet Take 10 mg by mouth every morning. 04/26/19  Yes [provider]  lisinopril (ZESTRIL) 5 MG tablet Take 5 mg by mouth daily. 04/25/19  Yes [provider]  meloxicam (MOBIC) 15 MG tablet Take 0.5 tablets (7.5 mg total) by mouth daily as needed for pain. 12/20/19  Yes Edward Jolly, MD  metFORMIN (GLUCOPHAGE) 1000 MG tablet Take 1,000 mg by mouth 2 (two) times daily with a meal.   Yes [provider]  pravastatin (PRAVACHOL) 10 MG tablet Take 10 mg by mouth at bedtime. 07/20/22  Yes [provider]  pregabalin (LYRICA) 100 MG capsule Take 1 capsule (100 mg total) by mouth 3 (three) times daily. Patient taking differently: Take 100 mg by mouth 3 (three) times daily as needed. 06/23/20  Yes Edward Jolly, MD  RYBELSUS 7 MG TABS Take 1 tablet by mouth daily. 07/20/22  Yes [provider]  tiZANidine (ZANAFLEX) 2 MG tablet Take 1 tablet (2 mg total) by mouth 2 (two) times daily as needed for muscle spasms. Patient not taking: Reported on 09/06/2022  12/20/19   Edward Jolly, MD    Allergies as of 09/06/2022   (No Known Allergies)    History reviewed. No pertinent family history.  Social History   Socioeconomic History   Marital status: Single    Spouse name: Not on file   Number of children: Not on file   Years of education: Not on file   Highest education level: Not on file  Occupational History   Not on file  Tobacco Use   Smoking status: Light Smoker    Packs/day: .1    Types: Cigarettes   Smokeless tobacco: Never   Tobacco comments:    2-3 per day  Vaping Use   Vaping Use: Never used  Substance and Sexual Activity   Alcohol use: No   Drug use: No   Sexual activity: Never  Other Topics Concern   Not on file  Social History Narrative   Not on file   Social Determinants of Health   Financial Resource Strain: Not on file  Food Insecurity: Not on file  Transportation Needs: Not on file  Physical Activity: Not on file  Stress: Not on file  Social Connections: Not on file  Intimate Partner Violence: Not on file    Review of Systems: See HPI, otherwise negative ROS  Physical Exam: Ht 5\' 6"  (1.676 m)   Wt 90.7 kg   BMI 32.28 kg/m  General:   Alert,  pleasant and cooperative in NAD  Head:  Normocephalic and atraumatic. Neck:  Supple; no masses or thyromegaly. Lungs:  Clear throughout to auscultation.    Heart:  Regular rate and rhythm. Abdomen:  Soft, nontender and nondistended. Normal bowel sounds, without guarding, and without rebound.   Neurologic:  Alert and  oriented x4;  grossly normal neurologically.  Impression/Plan: Colleen Andrade is now here to undergo a screening colonoscopy.  Risks, benefits, and alternatives regarding colonoscopy have been reviewed with the patient.  Questions have been answered.  All parties agreeable.

## 2022-10-11 NOTE — Op Note (Signed)
[facility] Patient Name: Colleen AskewDiana Andrade Procedure Date: 10/11/2022 10:59 AM MRN: [id number] Account #: [id number] Date of Birth: 01-30-1956 Admit Type: Outpatient Age: [age] [facility] Gender: Female Note Status: Finalized Instrument Name: [id number] Procedure:             Colonoscopy Indications:           Screening for colorectal malignant neoplasm Providers:             Midge Miniumarren Rishaan Gunner MD, MD Referring MD:          Lisbeth PlyVyvyan Y. Wynelle LinkSun, MD (Referring MD) Medicines:             Propofol per Anesthesia Complications:         No immediate complications. Procedure:             Pre-Anesthesia Assessment:                        - Prior to the procedure, a History and Physical was                         performed, and patient medications and allergies were                         reviewed. The patient's tolerance of previous                         anesthesia was also reviewed. The risks and benefits                         of the procedure and the sedation options and risks                         were discussed with the patient. All questions were                         answered, and informed consent was obtained. Prior                         Anticoagulants: The patient has taken no anticoagulant                         or antiplatelet agents. ASA Grade Assessment: II - A                         patient with mild systemic disease. After reviewing                         the risks and benefits, the patient was deemed in                         satisfactory condition to undergo the procedure.                        After obtaining informed consent, the colonoscope was                         passed under direct vision. Throughout the procedure,  the patient's blood pressure, pulse, and oxygen                         saturations were monitored continuously. The                         Colonoscope was introduced through the anus and                          advanced to the the cecum, identified by appendiceal                         orifice and ileocecal valve. The colonoscopy was                         performed without difficulty. The patient tolerated                         the procedure well. The quality of the bowel                         preparation was excellent. Findings:      The perianal and digital rectal examinations were normal.      Two sessile polyps were found in the sigmoid colon. The polyps were 2 to       3 mm in size. These polyps were removed with a cold biopsy forceps.       Resection and retrieval were complete.      A few small-mouthed diverticula were found in the sigmoid colon.      Non-bleeding internal hemorrhoids were found during retroflexion. The       hemorrhoids were Grade I (internal hemorrhoids that do not prolapse). Impression:            - Two 2 to 3 mm polyps in the sigmoid colon, removed                         with a cold biopsy forceps. Resected and retrieved.                        - Diverticulosis in the sigmoid colon.                        - Non-bleeding internal hemorrhoids. Recommendation:        - Discharge patient to home.                        - Resume previous diet.                        - Continue present medications.                        - Await pathology results.                        - If the pathology report reveals adenomatous tissue,                         then repeat the colonoscopy for surveillance in 7  years. Procedure Code(s):     --- Professional ---                        [phone], Colonoscopy, flexible; with biopsy, single or                         multiple Diagnosis Code(s):     --- Professional ---                        Z12.11, Encounter for screening for malignant neoplasm                         of colon                        D12.5, Benign neoplasm of sigmoid colon CPT copyright 2022 American Medical Association.  All rights reserved. The codes documented in this report are preliminary and upon coder review may  be revised to meet current compliance requirements. Midge Minium MD, MD 10/11/2022 11:48:32 AM This report has been signed electronically. Number of Addenda: 0 Note Initiated On: 10/11/2022 10:59 AM Scope Withdrawal Time: 0 hours 9 minutes 38 seconds  Total Procedure Duration: 0 hours 16 minutes 5 seconds  Estimated Blood Loss:  Estimated blood loss: none.      [facility]

## 2022-10-12 ENCOUNTER — Encounter: Payer: Self-pay | Admitting: Gastroenterology

## 2022-10-12 LAB — SURGICAL PATHOLOGY

## 2022-10-12 LAB — HM COLONOSCOPY

## 2022-10-13 ENCOUNTER — Encounter: Payer: Self-pay | Admitting: Gastroenterology

## 2022-10-15 DIAGNOSIS — Z124 Encounter for screening for malignant neoplasm of cervix: Secondary | ICD-10-CM | POA: Diagnosis not present

## 2022-10-15 DIAGNOSIS — Z01411 Encounter for gynecological examination (general) (routine) with abnormal findings: Secondary | ICD-10-CM | POA: Diagnosis not present

## 2022-10-15 DIAGNOSIS — Z1331 Encounter for screening for depression: Secondary | ICD-10-CM | POA: Diagnosis not present

## 2022-10-15 DIAGNOSIS — N760 Acute vaginitis: Secondary | ICD-10-CM | POA: Diagnosis not present

## 2022-10-15 LAB — HM PAP SMEAR: HM Pap smear: ABNORMAL

## 2022-11-08 ENCOUNTER — Other Ambulatory Visit: Payer: Self-pay | Admitting: Gastroenterology

## 2023-03-04 LAB — BASIC METABOLIC PANEL
BUN: 15 (ref 4–21)
CO2: 21 (ref 13–22)
Chloride: 103 (ref 99–108)
Creatinine: 0.7 (ref 0.5–1.1)
Glucose: 71
Potassium: 4.3 meq/L (ref 3.5–5.1)
Sodium: 137 (ref 137–147)

## 2023-03-04 LAB — HEPATIC FUNCTION PANEL
ALT: 17 U/L (ref 7–35)
AST: 18 (ref 13–35)
Alkaline Phosphatase: 76 (ref 25–125)
Bilirubin, Total: 0.3

## 2023-03-04 LAB — COMPREHENSIVE METABOLIC PANEL
Albumin: 4.4 (ref 3.5–5.0)
Calcium: 9.2 (ref 8.7–10.7)
Globulin: 2.9
eGFR: 93

## 2023-03-04 LAB — HEMOGLOBIN A1C: Hemoglobin A1C: 5.7

## 2023-03-04 LAB — LIPID PANEL
Cholesterol: 124 (ref 0–200)
HDL: 50 (ref 35–70)
LDL Cholesterol: 52
Triglycerides: 128 (ref 40–160)

## 2023-03-08 ENCOUNTER — Other Ambulatory Visit: Payer: Self-pay | Admitting: Family Medicine

## 2023-03-08 DIAGNOSIS — E2839 Other primary ovarian failure: Secondary | ICD-10-CM

## 2023-03-15 ENCOUNTER — Ambulatory Visit
Admission: RE | Admit: 2023-03-15 | Discharge: 2023-03-15 | Disposition: A | Discharge status: Home / Self Care | Payer: Medicare Other | Source: Ambulatory Visit | Attending: Family Medicine | Admitting: Family Medicine

## 2023-03-15 DIAGNOSIS — E2839 Other primary ovarian failure: Secondary | ICD-10-CM | POA: Insufficient documentation

## 2023-03-15 LAB — HM DEXA SCAN

## 2023-03-17 ENCOUNTER — Telehealth: Payer: Self-pay | Admitting: Gastroenterology

## 2023-03-17 NOTE — Telephone Encounter (Signed)
Shirlee from [facility] called in to check on patient procedure status.

## 2023-08-26 ENCOUNTER — Encounter: Payer: Self-pay | Admitting: Family Medicine

## 2023-08-26 ENCOUNTER — Ambulatory Visit (INDEPENDENT_AMBULATORY_CARE_PROVIDER_SITE_OTHER): Payer: Medicare Other | Admitting: Family Medicine

## 2023-08-26 VITALS — BP 116/77 | HR 65 | Ht 64.0 in | Wt 213.0 lb

## 2023-08-26 DIAGNOSIS — E782 Mixed hyperlipidemia: Secondary | ICD-10-CM

## 2023-08-26 DIAGNOSIS — M8580 Other specified disorders of bone density and structure, unspecified site: Secondary | ICD-10-CM

## 2023-08-26 DIAGNOSIS — E559 Vitamin D deficiency, unspecified: Secondary | ICD-10-CM

## 2023-08-26 DIAGNOSIS — K635 Polyp of colon: Secondary | ICD-10-CM | POA: Diagnosis not present

## 2023-08-26 DIAGNOSIS — M5030 Other cervical disc degeneration, unspecified cervical region: Secondary | ICD-10-CM

## 2023-08-26 DIAGNOSIS — E118 Type 2 diabetes mellitus with unspecified complications: Secondary | ICD-10-CM

## 2023-08-26 DIAGNOSIS — M51362 Other intervertebral disc degeneration, lumbar region with discogenic back pain and lower extremity pain: Secondary | ICD-10-CM

## 2023-08-26 DIAGNOSIS — F17210 Nicotine dependence, cigarettes, uncomplicated: Secondary | ICD-10-CM

## 2023-08-26 DIAGNOSIS — R87820 Cervical low risk human papillomavirus (HPV) DNA test positive: Secondary | ICD-10-CM

## 2023-08-26 DIAGNOSIS — Z122 Encounter for screening for malignant neoplasm of respiratory organs: Secondary | ICD-10-CM

## 2023-08-26 DIAGNOSIS — E1140 Type 2 diabetes mellitus with diabetic neuropathy, unspecified: Secondary | ICD-10-CM | POA: Diagnosis not present

## 2023-08-26 DIAGNOSIS — Z780 Asymptomatic menopausal state: Secondary | ICD-10-CM

## 2023-08-26 LAB — POCT GLYCOSYLATED HEMOGLOBIN (HGB A1C): HbA1c POC (<> result, manual entry): 6 % (ref 4.0–5.6)

## 2023-08-26 MED ORDER — PRAVASTATIN SODIUM 20 MG PO TABS
20.0000 mg | ORAL_TABLET | Freq: Every day | ORAL | 1 refills | Status: DC
Start: 2023-08-26 — End: 2024-04-24

## 2023-08-26 NOTE — Progress Notes (Signed)
 New Patient Office Visit  Subjective    Patient ID: Colleen Andrade, female    DOB: 02-08-1956  Age: 68 y.o. MRN: [id number]  CC:  Chief Complaint  Patient presents with   Establish Care    Establish Care , diabetic, herniated disc in Neck    HPI Colleen Andrade presents to establish care. 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  She has a history of laparoscopic cholecystectomy.  Last mammogram 09/06/2022.  Colonoscopy 10/11/2022 hyperplastic polyps of sigmoid colon, HPV positive Pap 10/15/2022 and normal pap . She is doing an excellent job with her diabetes.  A1c is running 5.5-5.7 over the last year.  Today A1c is 6.0%.  She takes glipizide XL 10 mg daily, metformin 1000 mg twice daily and Rybelsus 7 mg daily.  She has peripheral neuropathy in both feet and RLS.  For this she takes Lyrica 100 mg nightly.  Her prescription is written for TID  but she does not take more than nightly. She has cervical and lumbar DDD and is on tizanidine and meloxicam 15 mg,  She does daily stretches and thsi helps.  She takes half of the Mobic on the days that she needs pain relief which lately is every day.    She has been to pain management and had epidural steroid injections in the past and they were beneficial. Bone mineral density 03/2023: left femur neck T = -1.6, lumbar spine T = -2.1.  10-year risk of fracture is 0.9% for hip and 5% for major osteoporotic fracture. She quit smoking last year but she is vaping of 5% nicotine solution she no longer has a cough.  She has albuterol inhaler and Advair 100-50.  She has not had a low-dose CT scan of the lungs      Outpatient Encounter Medications as of 08/26/2023  Medication Sig   pravastatin (PRAVACHOL) 20 MG tablet Take 1 tablet (20 mg total) by mouth daily.   albuterol (VENTOLIN HFA) 108 (90 Base) MCG/ACT  inhaler Inhale into the lungs.   ASPIRIN 81 PO Take by mouth daily.   DULoxetine (CYMBALTA) 60 MG capsule Take 1 capsule (60 mg total) by mouth daily.   fluticasone-salmeterol (ADVAIR) 100-50 MCG/ACT AEPB 1 puff Inhalation every 12 hrs as needed   glipiZIDE (GLUCOTROL XL) 10 MG 24 hr tablet Take 10 mg by mouth every morning.   lisinopril (ZESTRIL) 5 MG tablet Take 5 mg by mouth daily.   meloxicam (MOBIC) 15 MG tablet Take 0.5 tablets (7.5 mg total) by mouth daily as needed for pain.   metFORMIN (GLUCOPHAGE) 1000 MG tablet Take 1,000 mg by mouth 2 (two) times daily with a meal.   pregabalin (LYRICA) 100 MG capsule Take 1 capsule (100 mg total) by mouth 3 (three) times daily. (Patient taking differently: Take 100 mg by mouth 3 (three) times daily as needed.)   RYBELSUS 7 MG TABS Take 1 tablet by mouth daily.   tiZANidine (ZANAFLEX) 2 MG tablet Take 1 tablet (2 mg total) by mouth 2 (two) times daily as needed for muscle spasms. (Patient not taking: Reported on 09/06/2022)   [DISCONTINUED] pravastatin (PRAVACHOL) 10 MG tablet Take 10 mg by mouth at bedtime.   No facility-administered encounter medications on file as of 08/26/2023.    Past Medical History:  Diagnosis Date   Asthma    Diabetes mellitus without complication (HCC)     Past  Surgical History:  Procedure Laterality Date   CHOLECYSTECTOMY     COLONOSCOPY WITH PROPOFOL N/A 10/11/2022   Procedure: COLONOSCOPY WITH PROPOFOL with polypectomy;  Surgeon: Midge Minium, MD;  Location: [facility];  Service: Endoscopy;  Laterality: N/A;    History reviewed. No pertinent family history.  Social History   Socioeconomic History   Marital status: Single    Spouse name: Not on file   Number of children: Not on file   Years of education: Not on file   Highest education level: Not on file  Occupational History   Not on file  Tobacco Use   Smoking status: Light Smoker    Current packs/day: 0.10    Types: Cigarettes    Passive  exposure: Current   Smokeless tobacco: Never   Tobacco comments:    2-3 per day  Vaping Use   Vaping status: Some Days  Substance and Sexual Activity   Alcohol use: Yes   Drug use: Never   Sexual activity: Never  Other Topics Concern   Not on file  Social History Narrative   Not on file   Social Drivers of Health   Financial Resource Strain: Not on file  Food Insecurity: Not on file  Transportation Needs: Not on file  Physical Activity: Not on file  Stress: Not on file  Social Connections: Not on file  Intimate Partner Violence: Not on file    ROS      Objective    BP 116/77 (BP Location: Left Arm, Patient Position: Sitting, Cuff Size: Normal)   Pulse 65   Ht 5\' 4"  (1.626 m)   Wt 213 lb (96.6 kg)   SpO2 96%   BMI 36.56 kg/m   Physical Exam Vitals and nursing note reviewed.  Constitutional:      Appearance: Normal appearance.  HENT:     Head: Normocephalic and atraumatic.  Eyes:     Conjunctiva/sclera: Conjunctivae normal.  Cardiovascular:     Rate and Rhythm: Normal rate and regular rhythm.     Pulses:          Dorsalis pedis pulses are 2+ on the right side and 2+ on the left side.       Posterior tibial pulses are 2+ on the right side and 2+ on the left side.  Pulmonary:     Effort: Pulmonary effort is normal.     Breath sounds: Normal breath sounds.  Musculoskeletal:     Right lower leg: No edema.     Left lower leg: No edema.  Feet:     Right foot:     Protective Sensation: 5 sites tested.  3 sites sensed.     Skin integrity: Skin integrity normal.     Toenail Condition: Right toenails are normal.     Left foot:     Protective Sensation: 5 sites tested.  3 sites sensed.     Skin integrity: Skin integrity normal.     Toenail Condition: Left toenails are normal.  Skin:    General: Skin is warm and dry.  Neurological:     Mental Status: She is alert and oriented to person, place, and time.  Psychiatric:        Mood and Affect: Mood normal.         Behavior: Behavior normal.        Thought Content: Thought content normal.        Judgment: Judgment normal.         Assessment &  Plan:   Problem List Items Addressed This Visit       Digestive   Polyp of sigmoid colon   2 hyperplastic polyps of the sigmoid colon are benign.  10-year follow-up        Endocrine   Controlled diabetes mellitus type 2 with complications (HCC)   Metformin 1000 mg twice daily, glipizide XL 10 mg daily, Rybelsus 7 mg daily, lisinopril 5 mg daily.  Reminded her to see the ophthalmologist yearly.  Checks her feet daily.  Wear shoes in the house      Relevant Medications   pravastatin (PRAVACHOL) 20 MG tablet     Musculoskeletal and Integument   DDD (degenerative disc disease), cervical   On meloxicam 7.5 mg most days and a muscle relaxer still has pain.  Cervical spine is worse than her lumbar per patient.  Does stretches daily.  Referral to pain management for potential ESI.      DDD (degenerative disc disease), lumbar   On meloxicam 7.5 mg most days and a muscle relaxer still has pain.  Does stretches daily.  Referral to pain management for potential ESI.      Osteopenia after menopause     Other   Encounter for screening for lung cancer   She quit smoking last year and is now vaping she thinks she has greater than a 30-pack-year history of smoking.  Discussed low-dose CT scan of the lungs and she is willing to do this      Mixed hyperlipidemia   On pravastatin 10 mg daily goal is LDL 70 or less labs 08/27/2022 LDL 89, HDL 51, total cholesterol 191, Triggs 173.  Will increase pravastatin to 20 mg daily and recheck labs in 3 months.      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  Aptima did not indication which HPV virus was positive.  Will consult with GYN.  Concerned that she may need an endometrial biopsy.      Other Visit Diagnoses       Type 2 diabetes mellitus with diabetic neuropathy, without long-term current use of insulin (HCC)    -  Primary   Relevant Medications   pravastatin (PRAVACHOL) 20 MG tablet   Other Relevant Orders   POCT HgB A1C (Completed)   Ambulatory referral to Pain Clinic   CBC with Differential/Platelet   Comprehensive metabolic panel   Lipid panel   TSH     Screening for lung cancer       Relevant Orders   CT CHEST LUNG CANCER SCREENING LOW DOSE WO CONTRAST     Nicotine dependence, cigarettes, uncomplicated       Relevant Orders   CT CHEST LUNG CANCER SCREENING LOW DOSE WO CONTRAST     Vitamin D deficiency       Relevant Orders   Vitamin D (25 hydroxy)       No follow-ups on file.   Alease Medina, MD

## 2023-08-27 DIAGNOSIS — M8580 Other specified disorders of bone density and structure, unspecified site: Secondary | ICD-10-CM | POA: Insufficient documentation

## 2023-08-27 DIAGNOSIS — E782 Mixed hyperlipidemia: Secondary | ICD-10-CM | POA: Insufficient documentation

## 2023-08-27 DIAGNOSIS — R87820 Cervical low risk human papillomavirus (HPV) DNA test positive: Secondary | ICD-10-CM | POA: Insufficient documentation

## 2023-08-27 DIAGNOSIS — M51369 Other intervertebral disc degeneration, lumbar region without mention of lumbar back pain or lower extremity pain: Secondary | ICD-10-CM | POA: Insufficient documentation

## 2023-08-27 NOTE — Assessment & Plan Note (Signed)
 On pravastatin 10 mg daily goal is LDL 70 or less labs 08/27/2022 LDL 89, HDL 51, total cholesterol 161, Triggs 173.  Will increase pravastatin to 20 mg daily and recheck labs in 3 months.

## 2023-08-27 NOTE — Assessment & Plan Note (Signed)
 2024 Aptima HPV test positive but pap test normal.  Aptima did not indication which HPV virus was positive.  Will consult with GYN.  Concerned that she may need an endometrial biopsy.

## 2023-08-27 NOTE — Assessment & Plan Note (Addendum)
 On meloxicam 7.5 mg most days and a muscle relaxer still has pain.  Does stretches daily.  Referral to pain management for potential ESI.

## 2023-08-27 NOTE — Assessment & Plan Note (Signed)
 She quit smoking last year and is now vaping she thinks she has greater than a 30-pack-year history of smoking.  Discussed low-dose CT scan of the lungs and she is willing to do this

## 2023-08-27 NOTE — Assessment & Plan Note (Signed)
 Metformin 1000 mg twice daily, glipizide XL 10 mg daily, Rybelsus 7 mg daily, lisinopril 5 mg daily.  Reminded her to see the ophthalmologist yearly.  Checks her feet daily.  Wear shoes in the house

## 2023-08-27 NOTE — Assessment & Plan Note (Signed)
 2 hyperplastic polyps of the sigmoid colon are benign.  10-year follow-up

## 2023-08-27 NOTE — Assessment & Plan Note (Addendum)
 On meloxicam 7.5 mg most days and a muscle relaxer still has pain.  Cervical spine is worse than her lumbar per patient.  Does stretches daily.  Referral to pain management for potential ESI.

## 2023-08-30 ENCOUNTER — Telehealth: Payer: Self-pay

## 2023-08-30 ENCOUNTER — Other Ambulatory Visit: Payer: Self-pay

## 2023-08-30 DIAGNOSIS — E118 Type 2 diabetes mellitus with unspecified complications: Secondary | ICD-10-CM

## 2023-08-30 MED ORDER — RYBELSUS 7 MG PO TABS
1.0000 | ORAL_TABLET | Freq: Every day | ORAL | 3 refills | Status: DC
Start: 2023-08-30 — End: 2024-04-24

## 2023-08-30 NOTE — Telephone Encounter (Signed)
 Reached out to GYN on behalf of Dr. Girtha Rm, she would like to follow up on continuity of care due to patient's HPV Aptima results being positive, she would like clarification as to if a endometrial biopsy is required. Our office contact information was left. Awaiting response.

## 2023-08-31 ENCOUNTER — Encounter: Payer: Self-pay | Admitting: Family Medicine

## 2023-08-31 ENCOUNTER — Other Ambulatory Visit: Payer: Self-pay | Admitting: Family Medicine

## 2023-08-31 DIAGNOSIS — R8582 Anal low risk human papillomavirus (HPV) DNA test positive: Secondary | ICD-10-CM

## 2023-08-31 DIAGNOSIS — E559 Vitamin D deficiency, unspecified: Secondary | ICD-10-CM

## 2023-08-31 LAB — VITAMIN D 25 HYDROXY (VIT D DEFICIENCY, FRACTURES): Vit D, 25-Hydroxy: 8.7 ng/mL — ABNORMAL LOW (ref 30.0–100.0)

## 2023-08-31 MED ORDER — ERGOCALCIFEROL 1.25 MG (50000 UT) PO CAPS
50000.0000 [IU] | ORAL_CAPSULE | ORAL | 0 refills | Status: AC
Start: 2023-08-31 — End: ?

## 2023-08-31 NOTE — Telephone Encounter (Signed)
 Copied from CRM [id number]. Topic: General - Call Back - No Documentation >> Aug 31, 2023 10:40 AM Gaetano Hawthorne wrote: Reason for CRM: Selena Batten from Dr. Rondel Baton office is calling back regarding this patent to inform Freada Bergeron that Dr. Hyacinth Meeker has not seen this patient previously. Call back number is [phone].  Contact center agent reached out to [facility], however, Dois Davenport was with a patient at the time, advised to send a CRM.

## 2023-09-06 ENCOUNTER — Telehealth: Payer: Self-pay | Admitting: Family Medicine

## 2023-09-06 NOTE — Telephone Encounter (Signed)
 Advised that she had a normal pap test but her HPV was positive.  Advised becaue she is 48 she needs some for of FU.  Referral to GYN for their opinion.

## 2023-09-28 ENCOUNTER — Encounter: Payer: Self-pay | Admitting: Family Medicine

## 2023-09-29 ENCOUNTER — Encounter (HOSPITAL_BASED_OUTPATIENT_CLINIC_OR_DEPARTMENT_OTHER): Payer: Self-pay

## 2023-10-11 ENCOUNTER — Encounter: Payer: Self-pay | Admitting: Student in an Organized Health Care Education/Training Program

## 2023-10-11 ENCOUNTER — Ambulatory Visit
Payer: 59 | Attending: Student in an Organized Health Care Education/Training Program | Admitting: Student in an Organized Health Care Education/Training Program

## 2023-10-11 VITALS — BP 133/70 | HR 72 | Temp 98.3°F | Resp 17 | Ht 65.0 in | Wt 214.8 lb

## 2023-10-11 DIAGNOSIS — M5412 Radiculopathy, cervical region: Secondary | ICD-10-CM | POA: Diagnosis present

## 2023-10-11 DIAGNOSIS — M47812 Spondylosis without myelopathy or radiculopathy, cervical region: Secondary | ICD-10-CM | POA: Diagnosis not present

## 2023-10-11 DIAGNOSIS — M47816 Spondylosis without myelopathy or radiculopathy, lumbar region: Secondary | ICD-10-CM | POA: Insufficient documentation

## 2023-10-11 DIAGNOSIS — M5459 Other low back pain: Secondary | ICD-10-CM | POA: Diagnosis not present

## 2023-10-11 DIAGNOSIS — M542 Cervicalgia: Secondary | ICD-10-CM | POA: Insufficient documentation

## 2023-10-11 NOTE — Patient Instructions (Signed)
 GENERAL RISKS AND COMPLICATIONS  What are the risk, side effects and possible complications? Generally speaking, most procedures are safe.  However, with any procedure there are risks, side effects, and the possibility of complications.  The risks and complications are dependent upon the sites that are lesioned, or the type of nerve block to be performed.  The closer the procedure is to the spine, the more serious the risks are.  Great care is taken when placing the radio frequency needles, block needles or lesioning probes, but sometimes complications can occur. Infection: Any time there is an injection through the skin, there is a risk of infection.  This is why sterile conditions are used for these blocks.  There are four possible types of infection. Localized skin infection. Central Nervous System Infection-This can be in the form of Meningitis, which can be deadly. Epidural Infections-This can be in the form of an epidural abscess, which can cause pressure inside of the spine, causing compression of the spinal cord with subsequent paralysis. This would require an emergency surgery to decompress, and there are no guarantees that the patient would recover from the paralysis. Discitis-This is an infection of the intervertebral discs.  It occurs in about 1% of discography procedures.  It is difficult to treat and it may lead to surgery.        2. Pain: the needles have to go through skin and soft tissues, will cause soreness.       3. Damage to internal structures:  The nerves to be lesioned may be near blood vessels or    other nerves which can be potentially damaged.       4. Bleeding: Bleeding is more common if the patient is taking blood thinners such as  aspirin, Coumadin, Ticiid, Plavix, etc., or if he/she have some genetic predisposition  such as hemophilia. Bleeding into the spinal canal can cause compression of the spinal  cord with subsequent paralysis.  This would require an emergency  surgery to  decompress and there are no guarantees that the patient would recover from the  paralysis.       5. Pneumothorax:  Puncturing of a lung is a possibility, every time a needle is introduced in  the area of the chest or upper back.  Pneumothorax refers to free air around the  collapsed lung(s), inside of the thoracic cavity (chest cavity).  Another two possible  complications related to a similar event would include: Hemothorax and Chylothorax.   These are variations of the Pneumothorax, where instead of air around the collapsed  lung(s), you may have blood or chyle, respectively.       6. Spinal headaches: They may occur with any procedures in the area of the spine.       7. Persistent CSF (Cerebro-Spinal Fluid) leakage: This is a rare problem, but may occur  with prolonged intrathecal or epidural catheters either due to the formation of a fistulous  track or a dural tear.       8. Nerve damage: By working so close to the spinal cord, there is always a possibility of  nerve damage, which could be as serious as a permanent spinal cord injury with  paralysis.       9. Death:  Although rare, severe deadly allergic reactions known as "Anaphylactic  reaction" can occur to any of the medications used.      10. Worsening of the symptoms:  We can always make thing worse.  What are the chances  of something like this happening? Chances of any of this occuring are extremely low.  By statistics, you have more of a chance of getting killed in a motor vehicle accident: while driving to the hospital than any of the above occurring .  Nevertheless, you should be aware that they are possibilities.  In general, it is similar to taking a shower.  Everybody knows that you can slip, hit your head and get killed.  Does that mean that you should not shower again?  Nevertheless always keep in mind that statistics do not mean anything if you happen to be on the wrong side of them.  Even if a procedure has a 1 (one) in a  1,000,000 (million) chance of going wrong, it you happen to be that one..Also, keep in mind that by statistics, you have more of a chance of having something go wrong when taking medications.  Who should not have this procedure? If you are on a blood thinning medication (e.g. Coumadin, Plavix, see list of "Blood Thinners"), or if you have an active infection going on, you should not have the procedure.  If you are taking any blood thinners, please inform your physician.  How should I prepare for this procedure? Do not eat or drink anything at least six hours prior to the procedure. Bring a driver with you .  It cannot be a taxi. Come accompanied by an adult that can drive you back, and that is strong enough to help you if your legs get weak or numb from the local anesthetic. Take all of your medicines the morning of the procedure with just enough water to swallow them. If you have diabetes, make sure that you are scheduled to have your procedure done first thing in the morning, whenever possible. If you have diabetes, take only half of your insulin dose and notify our nurse that you have done so as soon as you arrive at the clinic. If you are diabetic, but only take blood sugar pills (oral hypoglycemic), then do not take them on the morning of your procedure.  You may take them after you have had the procedure. Do not take aspirin or any aspirin-containing medications, at least eleven (11) days prior to the procedure.  They may prolong bleeding. Wear loose fitting clothing that may be easy to take off and that you would not mind if it got stained with Betadine or blood. Do not wear any jewelry or perfume Remove any nail coloring.  It will interfere with some of our monitoring equipment.  NOTE: Remember that this is not meant to be interpreted as a complete list of all possible complications.  Unforeseen problems may occur.  BLOOD THINNERS The following drugs contain aspirin or other products,  which can cause increased bleeding during surgery and should not be taken for 2 weeks prior to and 1 week after surgery.  If you should need take something for relief of minor pain, you may take acetaminophen which is found in Tylenol,m Datril, Anacin-3 and Panadol. It is not blood thinner. The products listed below are.  Do not take any of the products listed below in addition to any listed on your instruction sheet.  A.P.C or A.P.C with Codeine Codeine Phosphate Capsules #3 Ibuprofen Ridaura  ABC compound Congesprin Imuran rimadil  Advil Cope Indocin Robaxisal  Alka-Seltzer Effervescent Pain Reliever and Antacid Coricidin or Coricidin-D  Indomethacin Rufen  Alka-Seltzer plus Cold Medicine Cosprin Ketoprofen S-A-C Tablets  Anacin Analgesic Tablets or Capsules Coumadin  Korlgesic Salflex  Anacin Extra Strength Analgesic tablets or capsules CP-2 Tablets Lanoril Salicylate  Anaprox Cuprimine Capsules Levenox Salocol  Anexsia-D Dalteparin Magan Salsalate  Anodynos Darvon compound Magnesium Salicylate Sine-off  Ansaid Dasin Capsules Magsal Sodium Salicylate  Anturane Depen Capsules Marnal Soma  APF Arthritis pain formula Dewitt's Pills Measurin Stanback  Argesic Dia-Gesic Meclofenamic Sulfinpyrazone  Arthritis Bayer Timed Release Aspirin Diclofenac Meclomen Sulindac  Arthritis pain formula Anacin Dicumarol Medipren Supac  Analgesic (Safety coated) Arthralgen Diffunasal Mefanamic Suprofen  Arthritis Strength Bufferin Dihydrocodeine Mepro Compound Suprol  Arthropan liquid Dopirydamole Methcarbomol with Aspirin Synalgos  ASA tablets/Enseals Disalcid Micrainin Tagament  Ascriptin Doan's Midol Talwin  Ascriptin A/D Dolene Mobidin Tanderil  Ascriptin Extra Strength Dolobid Moblgesic Ticlid  Ascriptin with Codeine Doloprin or Doloprin with Codeine Momentum Tolectin  Asperbuf Duoprin Mono-gesic Trendar  Aspergum Duradyne Motrin or Motrin IB Triminicin  Aspirin plain, buffered or enteric coated  Durasal Myochrisine Trigesic  Aspirin Suppositories Easprin Nalfon Trillsate  Aspirin with Codeine Ecotrin Regular or Extra Strength Naprosyn Uracel  Atromid-S Efficin Naproxen Ursinus  Auranofin Capsules Elmiron Neocylate Vanquish  Axotal Emagrin Norgesic Verin  Azathioprine Empirin or Empirin with Codeine Normiflo Vitamin E  Azolid Emprazil Nuprin Voltaren  Bayer Aspirin plain, buffered or children's or timed BC Tablets or powders Encaprin Orgaran Warfarin Sodium  Buff-a-Comp Enoxaparin Orudis Zorpin  Buff-a-Comp with Codeine Equegesic Os-Cal-Gesic   Buffaprin Excedrin plain, buffered or Extra Strength Oxalid   Bufferin Arthritis Strength Feldene Oxphenbutazone   Bufferin plain or Extra Strength Feldene Capsules Oxycodone with Aspirin   Bufferin with Codeine Fenoprofen Fenoprofen Pabalate or Pabalate-SF   Buffets II Flogesic Panagesic   Buffinol plain or Extra Strength Florinal or Florinal with Codeine Panwarfarin   Buf-Tabs Flurbiprofen Penicillamine   Butalbital Compound Four-way cold tablets Penicillin   Butazolidin Fragmin Pepto-Bismol   Carbenicillin Geminisyn Percodan   Carna Arthritis Reliever Geopen Persantine   Carprofen Gold's salt Persistin   Chloramphenicol Goody's Phenylbutazone   Chloromycetin Haltrain Piroxlcam   Clmetidine heparin Plaquenil   Cllnoril Hyco-pap Ponstel   Clofibrate Hydroxy chloroquine Propoxyphen         Before stopping any of these medications, be sure to consult the physician who ordered them.  Some, such as Coumadin (Warfarin) are ordered to prevent or treat serious conditions such as "deep thrombosis", "pumonary embolisms", and other heart problems.  The amount of time that you may need off of the medication may also vary with the medication and the reason for which you were taking it.  If you are taking any of these medications, please make sure you notify your pain physician before you undergo any procedures.         Epidural Steroid  Injection Patient Information  Description: The epidural space surrounds the nerves as they exit the spinal cord.  In some patients, the nerves can be compressed and inflamed by a bulging disc or a tight spinal canal (spinal stenosis).  By injecting steroids into the epidural space, we can bring irritated nerves into direct contact with a potentially helpful medication.  These steroids act directly on the irritated nerves and can reduce swelling and inflammation which often leads to decreased pain.  Epidural steroids may be injected anywhere along the spine and from the neck to the low back depending upon the location of your pain.   After numbing the skin with local anesthetic (like Novocaine), a small needle is passed into the epidural space slowly.  You may experience a sensation of pressure while this  is being done.  The entire block usually last less than 10 minutes.  Conditions which may be treated by epidural steroids:  Low back and leg pain Neck and arm pain Spinal stenosis Post-laminectomy syndrome Herpes zoster (shingles) pain Pain from compression fractures  Preparation for the injection:  Do not eat any solid food or dairy products within 8 hours of your appointment.  You may drink clear liquids up to 3 hours before appointment.  Clear liquids include water, black coffee, juice or soda.  No milk or cream please. You may take your regular medication, including pain medications, with a sip of water before your appointment  Diabetics should hold regular insulin (if taken separately) and take 1/2 normal NPH dos the morning of the procedure.  Carry some sugar containing items with you to your appointment. A driver must accompany you and be prepared to drive you home after your procedure.  Bring all your current medications with your. An IV may be inserted and sedation may be given at the discretion of the physician.   A blood pressure cuff, EKG and other monitors will often be applied  during the procedure.  Some patients may need to have extra oxygen administered for a short period. You will be asked to provide medical information, including your allergies, prior to the procedure.  We must know immediately if you are taking blood thinners (like Coumadin/Warfarin)  Or if you are allergic to IV iodine contrast (dye). We must know if you could possible be pregnant.  Possible side-effects: Bleeding from needle site Infection (rare, may require surgery) Nerve injury (rare) Numbness & tingling (temporary) Difficulty urinating (rare, temporary) Spinal headache ( a headache worse with upright posture) Light -headedness (temporary) Pain at injection site (several days) Decreased blood pressure (temporary) Weakness in arm/leg (temporary) Pressure sensation in back/neck (temporary)  Call if you experience: Fever/chills associated with headache or increased back/neck pain. Headache worsened by an upright position. New onset weakness or numbness of an extremity below the injection site Hives or difficulty breathing (go to the emergency room) Inflammation or drainage at the infection site Severe back/neck pain Any new symptoms which are concerning to you  Please note:  Although the local anesthetic injected can often make your back or neck feel good for several hours after the injection, the pain will likely return.  It takes 3-7 days for steroids to work in the epidural space.  You may not notice any pain relief for at least that one week.  If effective, we will often do a series of three injections spaced 3-6 weeks apart to maximally decrease your pain.  After the initial series, we generally will wait several months before considering a repeat injection of the same type.  If you have any questions, please call [phone] [facility]

## 2023-10-11 NOTE — Progress Notes (Signed)
 PROVIDER NOTE: Interpretation of information contained herein should be left to medically-trained personnel. Specific patient instructions are provided elsewhere under "Patient Instructions" section of medical record. This document was created in part using AI and STT-dictation technology, any transcriptional errors that may result from this process are unintentional.  Patient: Colleen Andrade  Service: E/M Encounter  PCP: Alease Medina, MD  DOB: [birth year]  DOS: 10/11/2023  Provider: Edward Jolly, MD  MRN: [id number]  Delivery: Face-to-face  Specialty: Interventional Pain Management  Type: New Patient  Setting: Ambulatory outpatient facility  Specialty designation: 09  Referring Prov.: Ziglar, Eli Phillips, MD  Location: Outpatient office facility     Primary Reason(s) for Visit: Encounter for initial evaluation of one or more chronic problems (new to examiner) potentially causing chronic pain, and posing a threat to normal musculoskeletal function. (Level of risk: High) CC: Back Pain and Peripheral Neuropathy  HPI  [name] is a 68 y.o. year old, female patient, who comes for the first time to our practice referred by Ziglar, Eli Phillips, MD for our initial evaluation of her chronic pain. She has Cervical radicular pain; Chronic right-sided low back pain with right-sided sciatica; Cervical spondylosis; Lumbar facet joint pain; Lumbar facet arthropathy; Encounter for screening for lung cancer; Polyp of sigmoid colon; Controlled diabetes mellitus type 2 with complications (HCC); Mixed hyperlipidemia; DDD (degenerative disc disease), lumbar; Papanicolaou smear of cervix with low risk human papillomavirus (HPV) DNA test positive; Osteopenia after menopause; and Cervical facet joint syndrome on their problem list. Today she comes in for evaluation of her Back Pain and Peripheral Neuropathy  Pain Assessment: Location: Upper Back Radiating: bilat suprascapular, to lower back right side, around to front of right  leg to right toes, to right perineum; down arms to fingers bilat, right side worse Onset: More than a month ago Duration: Chronic pain Quality: Burning, Aching Severity: 5 /10 (subjective, self-reported pain score)  Effect on ADL: limits daily activities Timing: Constant Modifying factors: stretching, deep breathing, meds BP: 133/70  HR: 72  Onset and Duration: Date of onset: 10 years ago Cause of pain:  not noted Severity: No change since onset, NAS-11 at its worse: 10/10, NAS-11 at its best: 4/10, NAS-11 now: 3/10, and NAS-11 on the average: 5/10 Timing: Night and After activity or exercise Aggravating Factors: Lifiting, Prolonged sitting, and Walking uphill Alleviating Factors: Bending, Stretching, Cold packs, Hot packs, Lying down, Medications, Sleeping, Standing, and Warm showers or baths Associated Problems: Depression, Numbness, Sadness, Tingling, and Pain that does not allow patient to sleep Quality of Pain: Aching and Burning   Ms. Torregrossa is being evaluated for possible interventional pain management therapies for the treatment of her chronic pain.   Discussed the use of AI scribe software for clinical note transcription with the patient, who gave verbal consent to proceed.  History of Present Illness   [name] is a 68 year old female with chronic neck and back pain who presents with worsening symptoms.  She experiences worsening back pain that has been gradually increasing over time. The pain is described as nagging and extends from her hip down to her leg, accompanied by numbness, which she finds very unpleasant. She has attempted exercises to alleviate the pain, but it continues to worsen.  She has a history of neck pain, predominantly on the right side, which radiates into her arm. She received an injection in 2021 that provided relief for three years. The neck pain is chronic and often exacerbated by work-related activities, leading  to a sensation of pinching.  She performs exercises and stretches to manage the pain. No significant issues are noted on the left side, although she mentions some soreness in her knees. She is left-handed and describes herself as 'very left sided'.  Her current medications include Lyrica, which she takes nightly, and meloxicam, which she uses sparingly to manage pain flares. She avoids taking meloxicam daily due to concerns about its long-term effects on her body. She is cautious about using stronger medications like codeine, despite acknowledging their effectiveness.  She continues to work and is planning a vacation to [place], indicating an active lifestyle.      Of note, patient's last visit with me was 12/20/2019.  She has had 2 right C4, C5, C6, C7 cervical facet medial branch nerve blocks, these were done 04/24/2018 and 06/05/2018.  She states that these blocks were very helpful.  Future considerations would include repeating these nerve blocks for cervical facet arthropathy and then considering cervical radiofrequency ablation.  Her radiating arm pain is worse than her localize neck pain related to cervical radiculopathy.  For this reason we discussed starting with a right cervical ESI first.  We will also update our cervical MRI.  Meds   Current Outpatient Medications:    albuterol (VENTOLIN HFA) 108 (90 Base) MCG/ACT inhaler, Inhale into the lungs., Disp: , Rfl:    ASPIRIN 81 PO, Take by mouth daily., Disp: , Rfl:    DULoxetine (CYMBALTA) 60 MG capsule, Take 1 capsule (60 mg total) by mouth daily. (Patient taking differently: Take 40 mg by mouth daily.), Disp: 30 capsule, Rfl: 5   ergocalciferol (VITAMIN D2) 1.25 MG (50000 UT) capsule, Take 1 capsule (50,000 Units total) by mouth once a week., Disp: 13 capsule, Rfl: 0   fexofenadine (ALLEGRA) 30 MG/5ML suspension, Take 30 mg by mouth daily., Disp: , Rfl:    fluticasone-salmeterol (ADVAIR) 100-50 MCG/ACT AEPB, 1 puff Inhalation every 12 hrs as needed, Disp: , Rfl:     glipiZIDE (GLUCOTROL XL) 10 MG 24 hr tablet, Take 10 mg by mouth every morning., Disp: , Rfl:    lisinopril (ZESTRIL) 5 MG tablet, Take 5 mg by mouth daily., Disp: , Rfl:    meloxicam (MOBIC) 15 MG tablet, Take 0.5 tablets (7.5 mg total) by mouth daily as needed for pain., Disp: 30 tablet, Rfl: 5   metFORMIN (GLUCOPHAGE) 1000 MG tablet, Take 1,000 mg by mouth 2 (two) times daily with a meal., Disp: , Rfl:    pravastatin (PRAVACHOL) 20 MG tablet, Take 1 tablet (20 mg total) by mouth daily., Disp: 90 tablet, Rfl: 1   pregabalin (LYRICA) 100 MG capsule, Take 1 capsule (100 mg total) by mouth 3 (three) times daily. (Patient taking differently: Take 100 mg by mouth 3 (three) times daily as needed.), Disp: 90 capsule, Rfl: 5   RYBELSUS 7 MG TABS, Take 1 tablet (7 mg total) by mouth daily. (Patient not taking: Reported on 10/11/2023), Disp: 30 tablet, Rfl: 3   tiZANidine (ZANAFLEX) 2 MG tablet, Take 1 tablet (2 mg total) by mouth 2 (two) times daily as needed for muscle spasms. (Patient not taking: Reported on 09/06/2022), Disp: 30 tablet, Rfl: 5  Imaging Review    Narrative CLINICAL DATA:  Cervical pain.  EXAM: CERVICAL SPINE COMPLETE WITH FLEXION AND EXTENSION VIEWS  COMPARISON:  03/20/2018.  MRI 09/25/2013.  FINDINGS: Diffuse degenerative change with mild multilevel degenerative endplate osteophyte formation. Mild multifocal bilateral neural foraminal narrowing. 2 mm anterolisthesis C6-C7. Ligamentous ossification. No flexion  or extension abnormality identified. No acute bony abnormality identified. No evidence of fracture or dislocation.  IMPRESSION: Diffuse mild degenerative change. Mild multifocal bilateral neural foraminal narrowing. 2 mm anterolisthesis C6-C7. No acute abnormality identified.   Electronically Signed By: Maisie Fus  Register On: 12/21/2019 08:57  DG Lumbar Spine Complete W/Bend  Narrative CLINICAL DATA:  Chronic pain, no injury  EXAM: LUMBAR SPINE - COMPLETE WITH  BENDING VIEWS  COMPARISON:  None.  FINDINGS: There are 5 nonrib bearing lumbar-type vertebral bodies.  The vertebral body heights are maintained.  There is 6 mm of grade 1 anterolisthesis of L4 on L5 in the neutral position likely secondary to facet disease without significant change during flexion and extension. Otherwise, there is no static listhesis at other levels. There is no dynamic listhesis during flexion and extension at other levels. There is no spondylolysis.  There is no acute fracture.  There is degenerative disc disease with mild disc height loss at L4-5 and L5-S1. There is degenerative disc disease with disc height loss partially visualized at T9-10, T10-11 and T11-12. There is bilateral facet arthropathy at L4-5 and L5-S1. There are mildly prominent bilateral L5 transverse processes, but they do not appear to articulate with the sacrum nor is there subcortical reactive sclerosis or cystic changes.  IMPRESSION: 1. Lower lumbar spine spondylosis as described above.   Electronically Signed By: Elige Ko On: 03/21/2018 08:45  DG Si Joints  Narrative CLINICAL DATA:  Chronic low back pain  EXAM: BILATERAL SACROILIAC JOINTS - 3+ VIEW  COMPARISON:  None.  FINDINGS: There is mild subchondral sclerosis involving the inferior right sacroiliac joint. No SI joint widening or erosive changes. There are mildly prominent bilateral L5 transverse processes, but they do not appear to articulate with the sacrum nor is there subcortical reactive sclerosis or cystic changes. There is a small erosion involving the left inferior pubic body at the symphysis as can be seen with osteitis pubis. Bilateral hips demonstrate no focal abnormality. Bilateral hip joint spaces are maintained.  IMPRESSION: 1. 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   Electronically Signed By: Elige Ko On: 03/21/2018 08:47  DG Ankle Complete Left  Narrative CLINICAL DATA:   Left ankle pain after a fall  EXAM: LEFT ANKLE COMPLETE - 3+ VIEW  COMPARISON:  None.  FINDINGS: No fracture or malalignment. Minimal spurring medially. Small plantar calcaneal spur  IMPRESSION: 1. No acute osseous abnormality 2. Small plantar calcaneal spur   Electronically Signed By: Jasmine Pang M.D. On: 09/24/2016 19:30  DG Foot Complete Left  Narrative CLINICAL DATA:  Severe left foot pain in the region of the metatarsals, greater laterally, since a fall 2 weeks ago.  EXAM: LEFT FOOT - COMPLETE 3+ VIEW  COMPARISON:  Left ankle radiographs dated 09/24/2016.  FINDINGS: Flattening of the normal plantar arch. No fracture or dislocation. Mid mild inferior and posterior calcaneal spur formation. Mild posterior talotibial spur formation.  IMPRESSION: 1. No fracture. 2. Pes planus. 3. Mild degenerative changes.   Electronically Signed By: Beckie Salts M.D. On: 10/08/2016 16:56    Complexity Note: Imaging results reviewed.                         ROS  Cardiovascular: Daily Aspirin intake Pulmonary or Respiratory: Wheezing and difficulty taking a deep full breath (Asthma) Neurological: No reported neurological signs or symptoms such as seizures, abnormal skin sensations, urinary and/or fecal incontinence, being born with an abnormal open spine and/or  a tethered spinal cord Psychological-Psychiatric: Depressed Gastrointestinal: No reported gastrointestinal signs or symptoms such as vomiting or evacuating blood, reflux, heartburn, alternating episodes of diarrhea and constipation, inflamed or scarred liver, or pancreas or irrregular and/or infrequent bowel movements Genitourinary: No reported renal or genitourinary signs or symptoms such as difficulty voiding or producing urine, peeing blood, non-functioning kidney, kidney stones, difficulty emptying the bladder, difficulty controlling the flow of urine, or chronic kidney disease Hematological: No reported  hematological signs or symptoms such as prolonged bleeding, low or poor functioning platelets, bruising or bleeding easily, hereditary bleeding problems, low energy levels due to low hemoglobin or being anemic Endocrine: High blood sugar controlled without the use of insulin (NIDDM) Rheumatologic: No reported rheumatological signs and symptoms such as fatigue, joint pain, tenderness, swelling, redness, heat, stiffness, decreased range of motion, with or without associated rash Musculoskeletal: Negative for myasthenia gravis, muscular dystrophy, multiple sclerosis or malignant hyperthermia Work History: Working full time  Allergies  Ms. Belcourt has no known allergies.  Laboratory Chemistry Profile   Renal Lab Results  Component Value Date   BUN 15 03/04/2023   CREATININE 0.7 03/04/2023   LABCREA 158.5 08/29/2022   GFRAA >60 01/26/2018   GFRNONAA >60 01/26/2018     Electrolytes Lab Results  Component Value Date   NA 137 03/04/2023   K 4.3 03/04/2023   CL 103 03/04/2023   CALCIUM 9.2 03/04/2023     Hepatic Lab Results  Component Value Date   AST 18 03/04/2023   ALT 17 03/04/2023   ALBUMIN 4.4 03/04/2023   ALKPHOS 76 03/04/2023     ID Lab Results  Component Value Date   SARSCOV2NAA Not Detected 07/30/2020     Bone Lab Results  Component Value Date   VD25OH 8.7 (L) 08/30/2023     Endocrine Lab Results  Component Value Date   GLUCOSE 166 (H) 01/26/2018   HGBA1C 6.0 08/26/2023     Neuropathy Lab Results  Component Value Date   HGBA1C 6.0 08/26/2023     CNS No results found for: "COLORCSF", "APPEARCSF", "RBCCOUNTCSF", "WBCCSF", "POLYSCSF", "LYMPHSCSF", "EOSCSF", "PROTEINCSF", "GLUCCSF", "JCVIRUS", "CSFOLI", "IGGCSF", "LABACHR", "ACETBL"   Inflammation (CRP: Acute  ESR: Chronic) No results found for: "CRP", "ESRSEDRATE", "LATICACIDVEN"   Rheumatology No results found for: "RF", "ANA", "LABURIC", "URICUR", "LYMEIGGIGMAB", "LYMEABIGMQN", "HLAB27"    Coagulation Lab Results  Component Value Date   INR 1.04 01/26/2018   LABPROT 13.5 01/26/2018   APTT 31 01/26/2018   PLT 263 01/26/2018     Cardiovascular Lab Results  Component Value Date   TROPONINI <0.03 01/26/2018   HGB 13.5 01/26/2018   HCT 40.2 01/26/2018     Screening Lab Results  Component Value Date   SARSCOV2NAA Not Detected 07/30/2020     Cancer No results found for: "CEA", "CA125", "LABCA2"   Allergens No results found for: "ALMOND", "APPLE", "ASPARAGUS", "AVOCADO", "BANANA", "BARLEY", "BASIL", "BAYLEAF", "GREENBEAN", "LIMABEAN", "WHITEBEAN", "BEEFIGE", "REDBEET", "BLUEBERRY", "BROCCOLI", "CABBAGE", "MELON", "CARROT", "CASEIN", "CASHEWNUT", "CAULIFLOWER", "CELERY"     Note: Lab results reviewed.  Narrative & Impression  CLINICAL DATA:  Cervical pain.   EXAM: CERVICAL SPINE COMPLETE WITH FLEXION AND EXTENSION VIEWS   COMPARISON:  03/20/2018.  MRI 09/25/2013.   FINDINGS: Diffuse degenerative change with mild multilevel degenerative endplate osteophyte formation. Mild multifocal bilateral neural foraminal narrowing. 2 mm anterolisthesis C6-C7. Ligamentous ossification. No flexion or extension abnormality identified. No acute bony abnormality identified. No evidence of fracture or dislocation.   IMPRESSION: Diffuse mild degenerative change. Mild multifocal bilateral neural  foraminal narrowing. 2 mm anterolisthesis C6-C7. No acute abnormality identified.     CLINICAL DATA:  Neck pain and right arm pain.  Numbness.    EXAM:  MRI CERVICAL SPINE WITHOUT CONTRAST    TECHNIQUE:  Multiplanar, multisequence MR imaging was performed. No intravenous  contrast was administered.    COMPARISON:  None.    FINDINGS:  Normal alignment of the cervical vertebral bodies. They demonstrate  normal marrow signal. The cervical spinal cord demonstrates normal  signal intensity. No Chiari malformation.    C2-3:  No significant findings.    C3-4: Combination of a  shallow disc osteophyte complex and  asymmetric right sided facet disease contributes to right foraminal  stenosis and could affect the right C4 nerve root. No spinal or left  foraminal stenosis.    C4-5: Shallow disc osteophyte complex on the left with minimal left  foraminal encroachment. No spinal stenosis.    C5-6: Degenerative disc disease with a diffuse bulging annulus and  mild osteophytic ridging. There is mild flattening of the ventral  thecal sac and mild to moderate bilateral foraminal stenosis.    C6-7:  No significant findings.    C7-T1:  No significant findings.    Bulging discs are noted centrally at T1-2 and T2-3.    IMPRESSION:  Right foraminal stenosis at C3-4.    Mild left foraminal encroachment at C4-5.    Mild to moderate bilateral foraminal stenosis at C5-6.      Electronically Signed    By: Loralie Champagne M.D.    On: 09/25/2013 14:43   PFSH  Drug: Ms. Harkey  reports no history of drug use. Alcohol:  reports current alcohol use. Tobacco:  reports that she has been smoking cigarettes. She has been exposed to tobacco smoke. She has never used smokeless tobacco. Medical:  has a past medical history of Asthma and Diabetes mellitus without complication (HCC). Family: family history is not on file.  Past Surgical History:  Procedure Laterality Date   CHOLECYSTECTOMY     COLONOSCOPY WITH PROPOFOL N/A 10/11/2022   Procedure: COLONOSCOPY WITH PROPOFOL with polypectomy;  Surgeon: Midge Minium, MD;  Location: [facility];  Service: Endoscopy;  Laterality: N/A;   Active Ambulatory Problems    Diagnosis Date Noted   Cervical radicular pain 03/21/2018   Chronic right-sided low back pain with right-sided sciatica 03/21/2018   Cervical spondylosis 05/24/2019   Lumbar facet joint pain 05/24/2019   Lumbar facet arthropathy 05/24/2019   Encounter for screening for lung cancer 10/11/2022   Polyp of sigmoid colon 10/11/2022   Controlled diabetes  mellitus type 2 with complications (HCC) 08/26/2023   Mixed hyperlipidemia 08/27/2023   DDD (degenerative disc disease), lumbar 08/27/2023   Papanicolaou smear of cervix with low risk human papillomavirus (HPV) DNA test positive 08/27/2023   Osteopenia after menopause 08/27/2023   Cervical facet joint syndrome 10/11/2023   Resolved Ambulatory Problems    Diagnosis Date Noted   Cervicalgia 03/21/2018   Degenerative disc disease, cervical 03/21/2018   Chronic right SI joint pain 03/21/2018   Chronic pain syndrome 03/21/2018   Past Medical History:  Diagnosis Date   Asthma    Diabetes mellitus without complication (HCC)    Constitutional Exam  General appearance: Well nourished, well developed, and well hydrated. In no apparent acute distress Vitals:   10/11/23 1013  BP: 133/70  Pulse: 72  Resp: 17  Temp: 98.3 F (36.8 C)  SpO2: 99%  Weight: 214 lb 12.8 oz (97.4 kg)  Height: 5\' 5"  (1.651 m)   BMI Assessment: Estimated body mass index is 35.74 kg/m as calculated from the following:   Height as of this encounter: 5\' 5"  (1.651 m).   Weight as of this encounter: 214 lb 12.8 oz (97.4 kg).  BMI interpretation table: BMI level Category Range association with higher incidence of chronic pain  <18 kg/m2 Underweight   18.5-24.9 kg/m2 Ideal body weight   25-29.9 kg/m2 Overweight Increased incidence by 20%  30-34.9 kg/m2 Obese (Class I) Increased incidence by 68%  35-39.9 kg/m2 Severe obesity (Class II) Increased incidence by 136%  >40 kg/m2 Extreme obesity (Class III) Increased incidence by 254%   Patient's current BMI Ideal Body weight  Body mass index is 35.74 kg/m. Ideal body weight: 57 kg (125 lb 10.6 oz) Adjusted ideal body weight: 73.2 kg (161 lb 5.1 oz)   BMI Readings from Last 4 Encounters:  10/11/23 35.74 kg/m  08/26/23 36.56 kg/m  10/11/22 33.78 kg/m  12/20/19 36.32 kg/m   Wt Readings from Last 4 Encounters:  10/11/23 214 lb 12.8 oz (97.4 kg)  08/26/23 213  lb (96.6 kg)  10/11/22 190 lb 11.2 oz (86.5 kg)  12/20/19 225 lb (102.1 kg)    Psych/Mental status: Alert, oriented x 3 (person, place, & time)       Eyes: PERLA Respiratory: No evidence of acute respiratory distress  Cervical Spine Area Exam  Skin & Axial Inspection: No masses, redness, edema, swelling, or associated skin lesions Alignment: Symmetrical Functional ROM: Decreased ROM, to the right Stability: No instability detected Muscle Tone/Strength: Functionally intact. No obvious neuro-muscular anomalies detected. Sensory (Neurological): Dermatomal pain pattern and musculoskeletal Palpation: (+) Positive provocative maneuver for for cervical facet disease right greater than left  Positive Spurling's on the right        Upper Extremity (UE) Exam      Side: Right upper extremity   Side: Left upper extremity   Skin & Extremity Inspection: Skin color, temperature, and hair growth are WNL. No peripheral edema or cyanosis. No masses, redness, swelling, asymmetry, or associated skin lesions. No contractures.   Skin & Extremity Inspection: Skin color, temperature, and hair growth are WNL. No peripheral edema or cyanosis. No masses, redness, swelling, asymmetry, or associated skin lesions. No contractures.   Functional ROM: pain restricted ROM           Functional ROM: Unrestricted ROM           Muscle Tone/Strength: Functionally intact. No obvious neuro-muscular anomalies detected.   Muscle Tone/Strength: Functionally intact. No obvious neuro-muscular anomalies detected.   Sensory (Neurological): Dermatomal and neurogenic        Sensory (Neurological): Unimpaired           Palpation: No palpable anomalies               Palpation: No palpable anomalies               Provocative Test(s):  Phalen's test: deferred Tinel's test: deferred Apley's scratch test (touch opposite shoulder):  Action 1 (Across chest): Decreased Action 2 (Overhead): Decreased Action 3 (LB reach): Decreased      Provocative Test(s):  Phalen's test: deferred Tinel's test: deferred Apley's scratch test (touch opposite shoulder):  Action 1 (Across chest): deferred Action 2 (Overhead): deferred Action 3 (LB reach): deferred       Thoracic Spine Area Exam  Skin & Axial Inspection: No masses, redness, or swelling Alignment: Symmetrical Functional ROM: Unrestricted ROM Stability: No instability detected Muscle Tone/Strength:  Functionally intact. No obvious neuro-muscular anomalies detected. Sensory (Neurological): Unimpaired Muscle strength & Tone: No palpable anomalies   Lumbar Spine Area Exam  Skin & Axial Inspection: No masses, redness, or swelling Alignment: Symmetrical Functional ROM: Decreased ROM       Stability: No instability detected Muscle Tone/Strength: Functionally intact. No obvious neuro-muscular anomalies detected. Sensory (Neurological): Articular pain pattern musculoskeletal Palpation: No palpable anomalies       Provocative Tests: Hyperextension/rotation test: (+) bilaterally for facet joint pain. Lumbar quadrant test (Kemp's test): (+) bilaterally for facet joint pain.  Gait & Posture Assessment  Ambulation: Unassisted Gait: Relatively normal for age and body habitus Posture: WNL    Lower Extremity Exam      Side: Right lower extremity   Side: Left lower extremity  Stability: No instability observed           Stability: No instability observed          Skin & Extremity Inspection: Skin color, temperature, and hair growth are WNL. No peripheral edema or cyanosis. No masses, redness, swelling, asymmetry, or associated skin lesions. No contractures.   Skin & Extremity Inspection: Skin color, temperature, and hair growth are WNL. No peripheral edema or cyanosis. No masses, redness, swelling, asymmetry, or associated skin lesions. No contractures.  Functional ROM: Unrestricted ROM                   Functional ROM: Unrestricted ROM                  Muscle Tone/Strength:  Functionally intact. No obvious neuro-muscular anomalies detected.   Muscle Tone/Strength: Functionally intact. No obvious neuro-muscular anomalies detected.  Sensory (Neurological): Unimpaired   Sensory (Neurological): Unimpaired  Palpation: No palpable anomalies   Palpation: No palpable anomalies     Assessment  Primary Diagnosis & Pertinent Problem List: The primary encounter diagnosis was Cervical radicular pain. Diagnoses of Cervical facet joint syndrome, Cervical spondylosis, Lumbar facet joint pain, Lumbar facet arthropathy, and Cervicalgia were also pertinent to this visit.  Visit Diagnosis (New problems to examiner): 1. Cervical radicular pain   2. Cervical facet joint syndrome   3. Cervical spondylosis   4. Lumbar facet joint pain   5. Lumbar facet arthropathy   6. Cervicalgia    Plan of Care (Initial workup plan)  Assessment and Plan    Cervical radiculopathy   Chronic neck pain radiates to her right arm, indicating possible cervical radiculopathy. Symptoms have worsened since 2021, despite previous relief from an injection. Pain is predominantly right-sided, with numbness and tingling. She continues working, with pain exacerbated by certain movements. An updated MRI is necessary to assess changes since the last imaging. A cervical epidural injection is planned to alleviate arm pain, differing from previous nerve blocks. She prefers no sedation, having tolerated the previous procedure well. Order a cervical MRI to assess the current status of the cervical spine. Schedule a cervical epidural injection to alleviate arm pain. Continue Lyrica for neuropathic pain management. Use meloxicam judiciously for pain flares, avoiding daily use to prevent adverse effects on kidneys and stomach lining.  Lumbar radiculopathy   She reports nagging hip pain radiating down the leg with numbness, suggestive of lumbar radiculopathy. Symptoms have slowly worsened despite exercise and other  conservative measures. She is cautious about using stronger medications like codeine, preferring to manage with current medications. Continue the current pain management regimen with Lyrica and meloxicam as needed.  Consider lumbar epidural steroid injection.  Travel and follow-up  She plans to travel to [place] and will return on May 5th. Schedule a follow-up appointment for a cervical epidural injection on May 7th to manage anticipated soreness from travel.        Imaging Orders         MR CERVICAL SPINE WO CONTRAST      Procedure Orders         Cervical Epidural Injection    Provider-requested follow-up: Return in about 29 days (around 11/09/2023) for RIght C-ESI, in clinic [facility].  Future Appointments  Date Time Provider Department Center  10/18/2023  9:30 AM [facility]  11/09/2023 10:00 AM Edward Jolly, MD [facility] None  02/24/2024  8:10 AM Ziglar, Eli Phillips, MD [facility] None   I discussed the assessment and treatment plan with the patient. The patient was provided an opportunity to ask questions and all were answered. The patient agreed with the plan and demonstrated an understanding of the instructions.  Patient advised to call back or seek an in-person evaluation if the symptoms or condition worsens.  Duration of encounter: .  Total time on encounter, as per AMA guidelines included both the face-to-face and non-face-to-face time personally spent by the physician and/or other qualified health care professional(s) on the day of the encounter (includes time in activities that require the physician or other qualified health care professional and does not include time in activities normally performed by clinical staff). Physician's time may include the following activities when performed: Preparing to see the patient (e.g., pre-charting review of records, searching for previously ordered imaging, lab work, and nerve conduction tests) Review of prior analgesic  pharmacotherapies. Reviewing PMP Interpreting ordered tests (e.g., lab work, imaging, nerve conduction tests) Performing post-procedure evaluations, including interpretation of diagnostic procedures Obtaining and/or reviewing separately obtained history Performing a medically appropriate examination and/or evaluation Counseling and educating the patient/family/caregiver Ordering medications, tests, or procedures Referring and communicating with other health care professionals (when not separately reported) Documenting clinical information in the electronic or other health record Independently interpreting results (not separately reported) and communicating results to the patient/ family/caregiver Care coordination (not separately reported)  Note by: Edward Jolly, MD (TTS and AI technology used. I apologize for any typographical errors that were not detected and corrected.) Date: 10/11/2023; Time: 12:11 PM

## 2023-10-11 NOTE — Progress Notes (Signed)
 Safety precautions to be maintained throughout the outpatient stay will include: orient to surroundings, keep bed in low position, maintain call bell within reach at all times, provide assistance with transfer out of bed and ambulation.

## 2023-10-18 ENCOUNTER — Ambulatory Visit: Admission: RE | Admit: 2023-10-18 | Payer: Self-pay | Source: Ambulatory Visit

## 2023-11-09 ENCOUNTER — Ambulatory Visit: Admitting: Student in an Organized Health Care Education/Training Program

## 2023-12-05 ENCOUNTER — Other Ambulatory Visit: Payer: Self-pay

## 2023-12-05 DIAGNOSIS — E118 Type 2 diabetes mellitus with unspecified complications: Secondary | ICD-10-CM

## 2023-12-05 MED ORDER — GLIPIZIDE ER 10 MG PO TB24
10.0000 mg | ORAL_TABLET | Freq: Every morning | ORAL | 1 refills | Status: AC
Start: 2023-12-05 — End: ?

## 2023-12-05 MED ORDER — LISINOPRIL 5 MG PO TABS: 5.0000 mg | ORAL_TABLET | Freq: Every day | ORAL | 1 refills | Status: DC

## 2023-12-05 NOTE — Addendum Note (Signed)
 Addended by: Evans Him on: 12/05/2023 04:58 PM   Modules accepted: Orders

## 2023-12-09 ENCOUNTER — Other Ambulatory Visit: Payer: Self-pay | Admitting: Family Medicine

## 2023-12-09 DIAGNOSIS — E118 Type 2 diabetes mellitus with unspecified complications: Secondary | ICD-10-CM

## 2023-12-09 NOTE — Telephone Encounter (Signed)
 Copied from CRM [id number]. Topic: Clinical - Medication Refill >> Dec 09, 2023 12:54 PM DeAngela L wrote: Medication: glipiZIDE (GLUCOTROL XL) 10 MG 24 hr tablet   Has the patient contacted their pharmacy? Yes  (Agent: If no, request that the patient contact the pharmacy for the refill. If patient does not wish to contact the pharmacy document the reason why and proceed with request.) (Agent: If yes, when and what did the pharmacy advise?)  This is the patient's preferred pharmacy:   [facility] [id number] [place], [place] - [address] [place] [address] Phone: [phone] Fax: [phone]  Is this the correct pharmacy for this prescription? Yes  If no, delete pharmacy and type the correct one.   Has the prescription been filled recently? Yes   Is the patient out of the medication? Yes   Has the patient been seen for an appointment in the last year OR does the patient have an upcoming appointment? Yes   Can we respond through MyChart? Yes   Agent: Please be advised that Rx refills may take up to 3 business days. We ask that you follow-up with your pharmacy.

## 2023-12-19 ENCOUNTER — Telehealth: Payer: Self-pay

## 2023-12-19 DIAGNOSIS — E118 Type 2 diabetes mellitus with unspecified complications: Secondary | ICD-10-CM

## 2023-12-19 MED ORDER — GLIPIZIDE ER 10 MG PO TB24
10.0000 mg | ORAL_TABLET | Freq: Every morning | ORAL | 1 refills | Status: DC
Start: 2023-12-19 — End: 2024-04-24

## 2023-12-19 NOTE — Telephone Encounter (Signed)
 Copied from CRM [id number]. Topic: Clinical - Prescription Issue >> Dec 19, 2023 10:47 AM Ivette P wrote: Reason for CRM: PT called in because went to go pick up medication and was told they couldn't help her.   Pt is calling about medication   glipiZIDE  (GLUCOTROL  XL) 10 MG 24 hr tablet    Pt would like for medication to go to    [facility] [id number] [place], [place] - [address]  [place] [address]  Phone: [phone] Fax: [phone]   Pt would like to know if this change could be made.    Pls follow up with pt

## 2023-12-19 NOTE — Telephone Encounter (Signed)
Refill sent to [facility] °

## 2024-02-03 ENCOUNTER — Other Ambulatory Visit: Payer: Self-pay | Admitting: Family Medicine

## 2024-02-03 DIAGNOSIS — M5412 Radiculopathy, cervical region: Secondary | ICD-10-CM

## 2024-02-03 DIAGNOSIS — M47812 Spondylosis without myelopathy or radiculopathy, cervical region: Secondary | ICD-10-CM

## 2024-02-03 DIAGNOSIS — G894 Chronic pain syndrome: Secondary | ICD-10-CM

## 2024-02-03 MED ORDER — DULOXETINE HCL 60 MG PO CPEP
60.0000 mg | ORAL_CAPSULE | Freq: Every day | ORAL | 1 refills | Status: DC
Start: 2024-02-03 — End: 2024-04-24

## 2024-02-03 NOTE — Telephone Encounter (Signed)
 Copied from CRM [id number]. Topic: Clinical - Medication Refill >> Feb 03, 2024  9:51 AM Tobias L wrote: Medication: DULoxetine  (CYMBALTA ) 60 MG capsule Patient requesting Dr. Ziglar take over prescription, was previously prescribed by pain management, patient no longer seeing that provider. Patient is completely out prescription.   Has the patient contacted their pharmacy? Yes Told to contact office.   This is the patient's preferred pharmacy:  [facility] [id number] [place], [place] - [address] [place] [address] Phone: [phone] Fax: [phone]  Is this the correct pharmacy for this prescription? Yes  Has the prescription been filled recently? No  Is the patient out of the medication? Yes  Has the patient been seen for an appointment in the last year OR does the patient have an upcoming appointment? Yes  Can we respond through MyChart? No  Agent: Please be advised that Rx refills may take up to 3 business days. We ask that you follow-up with your pharmacy.

## 2024-02-24 ENCOUNTER — Ambulatory Visit: Payer: 59 | Admitting: Family Medicine

## 2024-04-24 ENCOUNTER — Ambulatory Visit: Admitting: Family Medicine

## 2024-04-24 ENCOUNTER — Encounter: Payer: Self-pay | Admitting: Family Medicine

## 2024-04-24 VITALS — BP 137/81 | HR 69 | Temp 98.2°F | Resp 18 | Ht 65.0 in | Wt 210.0 lb

## 2024-04-24 DIAGNOSIS — I10 Essential (primary) hypertension: Secondary | ICD-10-CM

## 2024-04-24 DIAGNOSIS — E782 Mixed hyperlipidemia: Secondary | ICD-10-CM

## 2024-04-24 DIAGNOSIS — Z23 Encounter for immunization: Secondary | ICD-10-CM | POA: Diagnosis not present

## 2024-04-24 DIAGNOSIS — Z7984 Long term (current) use of oral hypoglycemic drugs: Secondary | ICD-10-CM

## 2024-04-24 DIAGNOSIS — M5412 Radiculopathy, cervical region: Secondary | ICD-10-CM

## 2024-04-24 DIAGNOSIS — M47812 Spondylosis without myelopathy or radiculopathy, cervical region: Secondary | ICD-10-CM

## 2024-04-24 DIAGNOSIS — J449 Chronic obstructive pulmonary disease, unspecified: Secondary | ICD-10-CM | POA: Diagnosis not present

## 2024-04-24 DIAGNOSIS — E118 Type 2 diabetes mellitus with unspecified complications: Secondary | ICD-10-CM | POA: Diagnosis not present

## 2024-04-24 DIAGNOSIS — G894 Chronic pain syndrome: Secondary | ICD-10-CM

## 2024-04-24 MED ORDER — PREGABALIN 100 MG PO CAPS
100.0000 mg | ORAL_CAPSULE | Freq: Three times a day (TID) | ORAL | 5 refills | Status: AC
Start: 2024-04-24 — End: ?

## 2024-04-24 MED ORDER — METFORMIN HCL 1000 MG PO TABS
1000.0000 mg | ORAL_TABLET | Freq: Two times a day (BID) | ORAL | 1 refills | Status: AC
Start: 2024-04-24 — End: ?

## 2024-04-24 MED ORDER — LISINOPRIL 5 MG PO TABS
5.0000 mg | ORAL_TABLET | Freq: Every day | ORAL | 1 refills | Status: AC
Start: 2024-04-24 — End: ?

## 2024-04-24 MED ORDER — ALBUTEROL SULFATE HFA 108 (90 BASE) MCG/ACT IN AERS
2.0000 | INHALATION_SPRAY | Freq: Four times a day (QID) | RESPIRATORY_TRACT | 3 refills | Status: AC | PRN
Start: 2024-04-24 — End: ?

## 2024-04-24 MED ORDER — PRAVASTATIN SODIUM 20 MG PO TABS
20.0000 mg | ORAL_TABLET | Freq: Every day | ORAL | 1 refills | Status: AC
Start: 2024-04-24 — End: ?

## 2024-04-24 MED ORDER — GLIPIZIDE ER 10 MG PO TB24
10.0000 mg | ORAL_TABLET | Freq: Every morning | ORAL | 1 refills | Status: AC
Start: 2024-04-24 — End: ?

## 2024-04-24 MED ORDER — DULOXETINE HCL 60 MG PO CPEP
60.0000 mg | ORAL_CAPSULE | Freq: Every day | ORAL | 1 refills | Status: AC
Start: 2024-04-24 — End: ?

## 2024-04-24 MED ORDER — RYBELSUS 7 MG PO TABS
1.0000 | ORAL_TABLET | Freq: Every day | ORAL | 3 refills | Status: AC
Start: 2024-04-24 — End: ?

## 2024-04-24 NOTE — Assessment & Plan Note (Signed)
 She is on metformin 1000 mg twice daily, Glucotrol  XL 10 mg once daily and Rybelsus  7 mg daily.  She does take an 81 mg aspirin daily.  She is on lisinopril  5 mg daily.  Will check labs and the goal for LDL is less than 70.

## 2024-04-24 NOTE — Assessment & Plan Note (Signed)
 She is on pravastatin  20 mg daily.  Will check fasting lipid profile.  The goal is LDL less than 70.

## 2024-04-24 NOTE — Progress Notes (Signed)
 Established Patient Office Visit  Subjective   Patient ID: Colleen Andrade, female    DOB: [birth year]  Age: 68 y.o. MRN: [id number]  Chief Complaint  Patient presents with   Medical Management of Chronic Issues    HPI Discussed the use of AI scribe software for clinical note transcription with the patient, who gave verbal consent to proceed.  History of Present Illness   Colleen Andrade is a 68 year old female with diabetes and chronic back pain who presents for medication refill and management of her pain.  She experiences significant lower back pain, with severity rated between 7 and 8 out of 10. She reports that a doctor told her she has a herniated disc. The pain disrupts her sleep. She has been off pregabalin  for about a month, leading to increased pain. Previously, she took pregabalin  once daily in the evening. Insurance changes have delayed further evaluation, including an MRI.  She has diabetes, managed with metformin 1000 mg twice daily and glipizide . Occasionally, she experiences low blood sugar symptoms such as headache and shakiness if she skips a meal after taking glipizide . Her last A1c was 5.6%. She experiences tingling and neuropathy in her feet, which she finds bothersome.  Her medication regimen includes duloxetine  since 2015, pravastatin  for cholesterol, lisinopril  for blood pressure, and aspirin 81 mg daily. She has been out of pregabalin  for a month, exacerbating her back pain.  She has a history of smoking, starting at age 89, and currently smokes about 4-5 cigarettes a day. She wants to quit and has previously quit for 5-6 years. She drinks alcohol occasionally, about three times a month, typically one beer at a time.  She has a family history of colon issues; her mother died from a septic colon. She had a colonoscopy within the last year or two, during which a polyp was removed. She is due for a five year FOLLOW-UP. She also mentions a change in her vision and the  start of cataracts. She has no diabetic retinopathy       Objective:     BP 137/81 (BP Location: Left Arm, Patient Position: Sitting, Cuff Size: Normal)   Pulse 69   Temp 98.2 F (36.8 C) (Oral)   Resp 18   Ht 5' 5 (1.651 m)   Wt 210 lb (95.3 kg)   SpO2 94%   BMI 34.95 kg/m    Physical Exam Vitals and nursing note reviewed.  Constitutional:      Appearance: Normal appearance.  HENT:     Head: Normocephalic and atraumatic.  Eyes:     Conjunctiva/sclera: Conjunctivae normal.  Cardiovascular:     Rate and Rhythm: Normal rate and regular rhythm.     Pulses:          Dorsalis pedis pulses are 2+ on the right side and 2+ on the left side.  Pulmonary:     Effort: Pulmonary effort is normal.     Breath sounds: Normal breath sounds.  Musculoskeletal:     Right lower leg: No edema.     Left lower leg: No edema.  Feet:     Right foot:     Protective Sensation: 5 sites tested.  5 sites sensed.     Skin integrity: Skin integrity normal.     Toenail Condition: Right toenails are normal.     Left foot:     Protective Sensation: 5 sites tested.  5 sites sensed.     Skin integrity: Skin integrity normal.  Toenail Condition: Left toenails are normal.  Skin:    General: Skin is warm and dry.  Neurological:     Mental Status: She is alert and oriented to person, place, and time.  Psychiatric:        Mood and Affect: Mood normal.        Behavior: Behavior normal.        Thought Content: Thought content normal.        Judgment: Judgment normal.          No results found for any visits on 04/24/24.    The ASCVD Risk score (Arnett DK, et al., 2019) failed to calculate for the following reasons:   The valid total cholesterol range is 130 to 320 mg/dL    Assessment & Plan:  COPD mixed type (HCC) -     Albuterol Sulfate HFA; Inhale 2 puffs into the lungs every 6 (six) hours as needed for wheezing or shortness of breath.  Dispense: 18 each; Refill: 3  Cervical  spondylosis  -     Pregabalin ; Take 1 capsule (100 mg total) by mouth 3 (three) times daily.  Dispense: 90 capsule; Refill: 5 -     DULoxetine  HCl; Take 1 capsule (60 mg total) by mouth daily.  Dispense: 90 capsule; Refill: 1  Cervical radiculopathy -     Pregabalin ; Take 1 capsule (100 mg total) by mouth 3 (three) times daily.  Dispense: 90 capsule; Refill: 5 -     DULoxetine  HCl; Take 1 capsule (60 mg total) by mouth daily.  Dispense: 90 capsule; Refill: 1  Chronic pain syndrome -     Pregabalin ; Take 1 capsule (100 mg total) by mouth 3 (three) times daily.  Dispense: 90 capsule; Refill: 5 -     DULoxetine  HCl; Take 1 capsule (60 mg total) by mouth daily.  Dispense: 90 capsule; Refill: 1  Controlled type 2 diabetes mellitus with complication, without long-term current use of insulin (HCC) -     glipiZIDE  ER; Take 1 tablet (10 mg total) by mouth every morning.  Dispense: 90 tablet; Refill: 1 -     Rybelsus ; Take 1 tablet (7 mg total) by mouth daily.  Dispense: 30 tablet; Refill: 3 -     Lisinopril ; Take 1 tablet (5 mg total) by mouth daily.  Dispense: 90 tablet; Refill: 1 -     metFORMIN HCl; Take 1 tablet (1,000 mg total) by mouth 2 (two) times daily with a meal.  Dispense: 180 tablet; Refill: 1 -     Comprehensive metabolic panel with GFR; Future -     Hemoglobin A1c; Future -     Microalbumin / creatinine urine ratio; Future  Mixed hyperlipidemia Assessment & Plan: She is on pravastatin  20 mg daily.  Will check fasting lipid profile.  The goal is LDL less than 70.  Orders: -     Pravastatin  Sodium; Take 1 tablet (20 mg total) by mouth daily.  Dispense: 90 tablet; Refill: 1 -     Lipid panel; Future  Primary hypertension -     CBC with Differential/Platelet; Future  Immunization due Assessment & Plan: Flu vaccine and Prevnar 20 today  Orders: -     Pneumococcal conjugate vaccine 20-valent -     Flu vaccine HIGH DOSE PF(Fluzone Trivalent)  Controlled diabetes mellitus type 2  with complications (HCC) Assessment & Plan: She is on metformin 1000 mg twice daily, Glucotrol  XL 10 mg once daily and Rybelsus  7 mg daily.  She does  take an 81 mg aspirin daily.  She is on lisinopril  5 mg daily.  Will check labs and the goal for LDL is less than 70.      Return in about 3 months (around 07/25/2024).    Mira Balon K Telly Jawad, MD

## 2024-04-24 NOTE — Assessment & Plan Note (Signed)
 Flu vaccine and Prevnar 20 today

## 2024-04-25 ENCOUNTER — Other Ambulatory Visit: Payer: Self-pay | Admitting: Family Medicine

## 2024-04-25 ENCOUNTER — Ambulatory Visit

## 2024-04-25 DIAGNOSIS — I10 Essential (primary) hypertension: Secondary | ICD-10-CM

## 2024-04-25 DIAGNOSIS — E782 Mixed hyperlipidemia: Secondary | ICD-10-CM

## 2024-04-25 DIAGNOSIS — E118 Type 2 diabetes mellitus with unspecified complications: Secondary | ICD-10-CM

## 2024-04-25 DIAGNOSIS — E1140 Type 2 diabetes mellitus with diabetic neuropathy, unspecified: Secondary | ICD-10-CM

## 2024-04-26 LAB — CBC WITH DIFFERENTIAL/PLATELET
Basophils Absolute: 0 x10E3/uL (ref 0.0–0.2)
Basos: 0 %
EOS (ABSOLUTE): 0.3 x10E3/uL (ref 0.0–0.4)
Eos: 5 %
Hematocrit: 41.6 % (ref 34.0–46.6)
Hemoglobin: 13.5 g/dL (ref 11.1–15.9)
Immature Grans (Abs): 0 x10E3/uL (ref 0.0–0.1)
Immature Granulocytes: 0 %
Lymphocytes Absolute: 0.7 x10E3/uL (ref 0.7–3.1)
Lymphs: 10 %
MCH: 31 pg (ref 26.6–33.0)
MCHC: 32.5 g/dL (ref 31.5–35.7)
MCV: 96 fL (ref 79–97)
Monocytes Absolute: 0.5 x10E3/uL (ref 0.1–0.9)
Monocytes: 7 %
Neutrophils Absolute: 5.4 x10E3/uL (ref 1.4–7.0)
Neutrophils: 78 %
Platelets: 288 x10E3/uL (ref 150–450)
RBC: 4.35 x10E6/uL (ref 3.77–5.28)
RDW: 13.1 % (ref 11.7–15.4)
WBC: 7 x10E3/uL (ref 3.4–10.8)

## 2024-04-26 LAB — TSH: TSH: 2.7 u[IU]/mL (ref 0.450–4.500)

## 2024-04-26 LAB — LIPID PANEL
Chol/HDL Ratio: 3.2 ratio (ref 0.0–4.4)
Cholesterol, Total: 168 mg/dL (ref 100–199)
HDL: 53 mg/dL (ref >39)
LDL Chol Calc (NIH): 87 mg/dL (ref 0–99)
Triglycerides: 165 mg/dL — ABNORMAL HIGH (ref 0–149)
VLDL Cholesterol Cal: 28 mg/dL (ref 5–40)

## 2024-04-26 LAB — COMPREHENSIVE METABOLIC PANEL WITH GFR
ALT: 23 IU/L (ref 0–32)
AST: 22 IU/L (ref 0–40)
Albumin: 4.6 g/dL (ref 3.9–4.9)
Alkaline Phosphatase: 78 IU/L (ref 49–135)
BUN/Creatinine Ratio: 24 (ref 12–28)
BUN: 19 mg/dL (ref 8–27)
Bilirubin Total: 0.5 mg/dL (ref 0.0–1.2)
CO2: 20 mmol/L (ref 20–29)
Calcium: 9.3 mg/dL (ref 8.7–10.3)
Chloride: 105 mmol/L (ref 96–106)
Creatinine, Ser: 0.79 mg/dL (ref 0.57–1.00)
Globulin, Total: 2.7 g/dL (ref 1.5–4.5)
Glucose: 101 mg/dL — ABNORMAL HIGH (ref 70–99)
Potassium: 4.3 mmol/L (ref 3.5–5.2)
Sodium: 140 mmol/L (ref 134–144)
Total Protein: 7.3 g/dL (ref 6.0–8.5)
eGFR: 82 mL/min/1.73 (ref >59)

## 2024-04-26 LAB — HEMOGLOBIN A1C
Est. average glucose Bld gHb Est-mCnc: 128 mg/dL
Hgb A1c MFr Bld: 6.1 % — ABNORMAL HIGH (ref 4.8–5.6)

## 2024-04-26 LAB — MICROALBUMIN / CREATININE URINE RATIO
Creatinine, Urine: 131.3 mg/dL
Microalb/Creat Ratio: 16 mg/g{creat} (ref 0–29)
Microalbumin, Urine: 21.2 ug/mL

## 2024-05-13 ENCOUNTER — Ambulatory Visit: Payer: Self-pay | Admitting: Family Medicine

## 2024-07-12 ENCOUNTER — Telehealth: Payer: Self-pay | Admitting: Family Medicine

## 2024-07-12 ENCOUNTER — Encounter: Payer: Self-pay | Admitting: Family Medicine

## 2024-07-12 NOTE — Telephone Encounter (Signed)
 LM that I need to speak to her about her  meloxicam  prescription.  Will send a note in MyChart.

## 2024-07-25 ENCOUNTER — Ambulatory Visit: Admitting: Family Medicine

## 2024-07-26 ENCOUNTER — Telehealth (HOSPITAL_BASED_OUTPATIENT_CLINIC_OR_DEPARTMENT_OTHER): Payer: Self-pay

## 2024-07-26 NOTE — Telephone Encounter (Signed)
 Called and left msg to call back for pt regarding message sent to pt by Dr. Ziglar on 07/12/2024:       [name], Colleen Andrade,  I got notification from your pharmacy and I need to know if you are taking meloxicam  15mg  or 7.5mg  and whether you are taking this every day.  Best regards, Dr. Ziglar

## 2024-07-31 NOTE — Telephone Encounter (Signed)
 Pt's husband informed. He will ask pt to call us  back.

## 2024-08-01 ENCOUNTER — Telehealth: Payer: Self-pay | Admitting: Family Medicine

## 2024-08-01 NOTE — Telephone Encounter (Signed)
 Copied from CRM [id number]. Topic: Clinical - Medication Question >> Jul 31, 2024  4:29 PM Delon T wrote: Reason for RMF:Ejupzwu is taking Meloxicam  7.5 mg usually every other day depending on how she is feeling- returning call - please call if any issues
